# Patient Record
Sex: Female | Born: 1955 | Marital: Single | Smoking: Never smoker
Health system: Southern US, Community
[De-identification: ages and names within clinical notes are randomized; demographics above are authoritative.]

## PROBLEM LIST (undated history)

## (undated) DIAGNOSIS — N201 Calculus of ureter: Secondary | ICD-10-CM

## (undated) DIAGNOSIS — Z87442 Personal history of urinary calculi: Secondary | ICD-10-CM

## (undated) DIAGNOSIS — I341 Nonrheumatic mitral (valve) prolapse: Secondary | ICD-10-CM

## (undated) DIAGNOSIS — R200 Anesthesia of skin: Secondary | ICD-10-CM

## (undated) DIAGNOSIS — E039 Hypothyroidism, unspecified: Secondary | ICD-10-CM

## (undated) DIAGNOSIS — I10 Essential (primary) hypertension: Secondary | ICD-10-CM

## (undated) DIAGNOSIS — E785 Hyperlipidemia, unspecified: Secondary | ICD-10-CM

## (undated) DIAGNOSIS — S0450XA Injury of facial nerve, unspecified side, initial encounter: Secondary | ICD-10-CM

## (undated) DIAGNOSIS — I440 Atrioventricular block, first degree: Secondary | ICD-10-CM

## (undated) DIAGNOSIS — F419 Anxiety disorder, unspecified: Secondary | ICD-10-CM

## (undated) DIAGNOSIS — N200 Calculus of kidney: Secondary | ICD-10-CM

## (undated) HISTORY — DX: Anxiety disorder, unspecified: F41.9

## (undated) HISTORY — DX: Essential (primary) hypertension: I10

## (undated) HISTORY — DX: Hyperlipidemia, unspecified: E78.5

## (undated) HISTORY — PX: TONSILLECTOMY AND ADENOIDECTOMY: SUR1326

## (undated) HISTORY — PX: STRABISMUS SURGERY: SHX218

---

## 1985-10-11 HISTORY — PX: APPENDECTOMY: SHX54

## 1993-10-11 HISTORY — PX: TUBAL LIGATION: SHX77

## 1997-10-11 HISTORY — PX: MANDIBLE SURGERY: SHX707

## 2001-08-07 ENCOUNTER — Emergency Department (HOSPITAL_COMMUNITY): Admission: EM | Admit: 2001-08-07 | Discharge: 2001-08-07 | Payer: Self-pay

## 2002-06-18 ENCOUNTER — Other Ambulatory Visit: Admission: RE | Admit: 2002-06-18 | Discharge: 2002-06-18 | Payer: Self-pay | Admitting: Obstetrics and Gynecology

## 2003-11-11 ENCOUNTER — Other Ambulatory Visit: Admission: RE | Admit: 2003-11-11 | Discharge: 2003-11-11 | Payer: Self-pay | Admitting: Obstetrics and Gynecology

## 2005-01-27 ENCOUNTER — Other Ambulatory Visit: Admission: RE | Admit: 2005-01-27 | Discharge: 2005-01-27 | Payer: Self-pay | Admitting: Obstetrics and Gynecology

## 2005-12-21 ENCOUNTER — Other Ambulatory Visit: Admission: RE | Admit: 2005-12-21 | Discharge: 2005-12-21 | Payer: Self-pay | Admitting: Obstetrics and Gynecology

## 2007-11-08 LAB — HM PAP SMEAR

## 2010-06-07 LAB — HM MAMMOGRAPHY

## 2011-11-08 ENCOUNTER — Encounter: Payer: Self-pay | Admitting: Family Medicine

## 2011-11-08 ENCOUNTER — Ambulatory Visit (INDEPENDENT_AMBULATORY_CARE_PROVIDER_SITE_OTHER): Payer: PRIVATE HEALTH INSURANCE | Admitting: Family Medicine

## 2011-11-08 DIAGNOSIS — E039 Hypothyroidism, unspecified: Secondary | ICD-10-CM | POA: Insufficient documentation

## 2011-11-08 DIAGNOSIS — E785 Hyperlipidemia, unspecified: Secondary | ICD-10-CM

## 2011-11-08 DIAGNOSIS — F419 Anxiety disorder, unspecified: Secondary | ICD-10-CM | POA: Insufficient documentation

## 2011-11-08 DIAGNOSIS — I10 Essential (primary) hypertension: Secondary | ICD-10-CM

## 2011-11-08 LAB — HEMOGLOBIN A1C: Hgb A1c MFr Bld: 5.8 % (ref 4.0–6.0)

## 2012-01-03 ENCOUNTER — Other Ambulatory Visit: Payer: Self-pay | Admitting: Oncology

## 2012-01-26 ENCOUNTER — Other Ambulatory Visit: Payer: Self-pay | Admitting: Oncology

## 2012-01-31 ENCOUNTER — Ambulatory Visit: Payer: PRIVATE HEALTH INSURANCE | Admitting: Family Medicine

## 2012-02-14 ENCOUNTER — Ambulatory Visit (INDEPENDENT_AMBULATORY_CARE_PROVIDER_SITE_OTHER): Payer: PRIVATE HEALTH INSURANCE | Admitting: Family Medicine

## 2012-02-14 VITALS — BP 128/88 | HR 62 | Temp 98.0°F | Resp 16 | Ht 67.0 in | Wt 226.2 lb

## 2012-02-14 DIAGNOSIS — M7710 Lateral epicondylitis, unspecified elbow: Secondary | ICD-10-CM

## 2012-02-14 DIAGNOSIS — I10 Essential (primary) hypertension: Secondary | ICD-10-CM

## 2012-02-14 DIAGNOSIS — F411 Generalized anxiety disorder: Secondary | ICD-10-CM

## 2012-02-14 DIAGNOSIS — E039 Hypothyroidism, unspecified: Secondary | ICD-10-CM

## 2012-02-14 DIAGNOSIS — E785 Hyperlipidemia, unspecified: Secondary | ICD-10-CM

## 2012-02-14 MED ORDER — CLONAZEPAM 0.5 MG PO TABS: 0.5000 mg | ORAL_TABLET | Freq: Two times a day (BID) | ORAL | Status: DC | PRN

## 2012-02-14 MED ORDER — LEVOTHYROXINE SODIUM 50 MCG PO TABS: 50.0000 ug | ORAL_TABLET | Freq: Every day | ORAL | Status: DC

## 2012-02-14 MED ORDER — METOPROLOL TARTRATE 50 MG PO TABS: 50.0000 mg | ORAL_TABLET | Freq: Two times a day (BID) | ORAL | Status: DC

## 2012-02-14 MED ORDER — PRAVASTATIN SODIUM 40 MG PO TABS: 40.0000 mg | ORAL_TABLET | Freq: Every day | ORAL | Status: DC

## 2012-02-14 NOTE — Progress Notes (Signed)
  Subjective:    Patient ID: Ashley Stevenson, female    DOB: October 31, 1955, 56 y.o.   MRN: [id number]  HPI  Patient presents in routine follow up of multiple medical issues  Needs medication refills. Compliant with medications; denies side effects  Complains of lateral elbow pain that has not responded to NSAIDS or to tennis strap  SH/ Estranged husband who was living in nursing home(was a quadriplegic) died earlier today.         Teenage son. Currently with steady employment   Review of Systems     Objective:   Physical Exam  Constitutional: She appears well-developed.  Neck: Neck supple. No thyromegaly present.  Cardiovascular: Normal rate, regular rhythm and normal heart sounds.   Pulmonary/Chest: Effort normal and breath sounds normal.  Musculoskeletal:       Arms: Neurological: She is alert.  Skin: Skin is warm.        Assessment & Plan:   1. HTN (hypertension)  metoprolol (LOPRESSOR) 50 MG tablet  2. Dyslipidemia  pravastatin (PRAVACHOL) 40 MG tablet, Lipid panel  3. GAD (generalized anxiety disorder)  clonazePAM (KLONOPIN) 0.5 MG tablet  4. Hypothyroid  levothyroxine (SYNTHROID, LEVOTHROID) 50 MCG tablet  5. Lateral epicondylitis     Supportive counseling Offered to inject patient's elbow however patient declined Anticipatory guidance Follow up 3 months

## 2012-02-15 LAB — LIPID PANEL
HDL: 48 mg/dL (ref >39)
LDL Cholesterol: 154 mg/dL — ABNORMAL HIGH (ref 0–99)
Total CHOL/HDL Ratio: 5.2 Ratio

## 2012-02-16 ENCOUNTER — Encounter: Payer: Self-pay | Admitting: Family Medicine

## 2012-02-24 ENCOUNTER — Other Ambulatory Visit: Payer: Self-pay | Admitting: Family Medicine

## 2012-02-24 DIAGNOSIS — E785 Hyperlipidemia, unspecified: Secondary | ICD-10-CM

## 2012-02-24 MED ORDER — PRAVASTATIN SODIUM 40 MG PO TABS: 80.0000 mg | ORAL_TABLET | Freq: Every day | ORAL | Status: DC

## 2012-02-25 ENCOUNTER — Other Ambulatory Visit: Payer: Self-pay | Admitting: Oncology

## 2012-07-21 ENCOUNTER — Encounter: Payer: Self-pay | Admitting: Family Medicine

## 2012-07-21 ENCOUNTER — Other Ambulatory Visit: Payer: Self-pay | Admitting: *Deleted

## 2012-07-21 ENCOUNTER — Ambulatory Visit (INDEPENDENT_AMBULATORY_CARE_PROVIDER_SITE_OTHER): Payer: PRIVATE HEALTH INSURANCE | Admitting: Family Medicine

## 2012-07-21 VITALS — BP 130/84 | HR 62 | Temp 98.1°F | Resp 16 | Ht 66.0 in | Wt 226.4 lb

## 2012-07-21 DIAGNOSIS — F419 Anxiety disorder, unspecified: Secondary | ICD-10-CM

## 2012-07-21 DIAGNOSIS — Z79899 Other long term (current) drug therapy: Secondary | ICD-10-CM

## 2012-07-21 DIAGNOSIS — F411 Generalized anxiety disorder: Secondary | ICD-10-CM

## 2012-07-21 DIAGNOSIS — B309 Viral conjunctivitis, unspecified: Secondary | ICD-10-CM

## 2012-07-21 DIAGNOSIS — E785 Hyperlipidemia, unspecified: Secondary | ICD-10-CM

## 2012-07-21 DIAGNOSIS — I10 Essential (primary) hypertension: Secondary | ICD-10-CM

## 2012-07-21 DIAGNOSIS — Z23 Encounter for immunization: Secondary | ICD-10-CM

## 2012-07-21 DIAGNOSIS — E039 Hypothyroidism, unspecified: Secondary | ICD-10-CM

## 2012-07-21 MED ORDER — NAPHAZOLINE HCL 0.1 % OP SOLN: 1.0000 [drp] | Freq: Four times a day (QID) | OPHTHALMIC | Status: DC | PRN

## 2012-07-21 MED ORDER — CLONAZEPAM 0.5 MG PO TABS: 0.5000 mg | ORAL_TABLET | Freq: Two times a day (BID) | ORAL | Status: DC | PRN

## 2012-07-21 MED ORDER — ERYTHROMYCIN 5 MG/GM OP OINT: TOPICAL_OINTMENT | Freq: Four times a day (QID) | OPHTHALMIC | Status: DC

## 2012-07-21 MED ORDER — METOPROLOL TARTRATE 50 MG PO TABS: 50.0000 mg | ORAL_TABLET | Freq: Two times a day (BID) | ORAL | Status: DC

## 2012-07-21 NOTE — Progress Notes (Signed)
Subjective:    Patient ID: Ashley Stevenson, female    DOB: 13-Feb-1956, 56 y.o.   MRN: [id number]  HPI  Pt is doing well.  She is here for medication refills.  She has been on varying doses of pravastatin for yrs - after last labs, it was increased from 40 to 73 - however, pt reports she has NEVER fasted for her lab work - she was really surprised that lipids need to be drawn fasting.  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  He is in the [place] and would like to be marine.  She reports if she goes off her klonopin she gets very confused, will have a panic attack in public and someone will have to come get her.  She awoke this a.m. with a red left eye, when she blinks it feels gritty. She thought perhaps mascara or an eyelash was stuck.  She has been using visine with little relief. No photophobia, vision changes, or pain w/ eye movement.  Past Medical History  Diagnosis Date  . Hyperlipidemia   . Hypertension   . Unspecified hypothyroidism   . Anxiety     Review of Systems  Constitutional: Negative for activity change, appetite change, fatigue and unexpected weight change.  Eyes: Positive for pain and redness. Negative for photophobia, discharge, itching and visual disturbance.  Respiratory: Negative for shortness of breath.   Cardiovascular: Negative for chest pain and leg swelling.  Skin: Negative for rash.  Neurological: Negative for headaches.  Psychiatric/Behavioral: Negative for confusion and dysphoric mood. The patient is nervous/anxious.       BP 130/84  Pulse 62  Temp 98.1 F (36.7 C) (Oral)  Resp 16  Ht 5\' 6"  (1.676 m)  Wt 226 lb 6.4 oz (102.694 kg)  BMI 36.54 kg/m2  SpO2 97%  Objective:   Physical Exam  Constitutional: She is oriented to person, place, and time. She appears  well-developed and well-nourished. No distress.  HENT:  Head: Normocephalic and atraumatic.  Right Ear: External ear normal.  Left Ear: External ear normal.  Eyes: EOM and lids are normal. Pupils are equal, round, and reactive to light. No foreign bodies found. Right eye exhibits no chemosis, no discharge, no exudate and no hordeolum. No foreign body present in the right eye. Left eye exhibits chemosis. Left eye exhibits no discharge, no exudate and no hordeolum. No foreign body present in the left eye. Right conjunctiva is not injected. Right conjunctiva has no hemorrhage. Left conjunctiva is injected. Left conjunctiva has no hemorrhage. No scleral icterus.    Neck: Normal range of motion. Neck supple. No thyromegaly present.  Cardiovascular: Normal rate, regular rhythm and normal heart sounds.   Pulmonary/Chest: Effort normal and breath sounds normal.  Musculoskeletal: She exhibits no edema.  Lymphadenopathy:    She has no cervical adenopathy.  Neurological: She is alert and oriented to person, place, and time.  Skin: Skin is warm and dry. She is not diaphoretic. No erythema.  Psychiatric: She has a normal mood and affect. Her behavior is normal.          Assessment & Plan:  1. HTN - adequate control. Cont metoprolol 2. HPL - check flp - refill med AFTER reviewing lipid results to ensure dose is correct. 3.  Mult meds - check cmp 4. Hypothyroid - check tsh, refill levothyroxine AFTER reviewing tsh results. 5. Viral conjunctivitis - Napthcon and erythro oint. RTC if worsening.

## 2012-07-24 ENCOUNTER — Other Ambulatory Visit (INDEPENDENT_AMBULATORY_CARE_PROVIDER_SITE_OTHER): Payer: PRIVATE HEALTH INSURANCE | Admitting: *Deleted

## 2012-07-24 DIAGNOSIS — E039 Hypothyroidism, unspecified: Secondary | ICD-10-CM

## 2012-07-24 DIAGNOSIS — Z79899 Other long term (current) drug therapy: Secondary | ICD-10-CM

## 2012-07-24 DIAGNOSIS — E785 Hyperlipidemia, unspecified: Secondary | ICD-10-CM

## 2012-07-24 LAB — COMPREHENSIVE METABOLIC PANEL
AST: 25 U/L (ref 0–37)
Alkaline Phosphatase: 40 U/L (ref 39–117)
BUN: 18 mg/dL (ref 6–23)
Creat: 0.91 mg/dL (ref 0.50–1.10)
Glucose, Bld: 98 mg/dL (ref 70–99)
Potassium: 4.2 mEq/L (ref 3.5–5.3)
Total Bilirubin: 0.3 mg/dL (ref 0.3–1.2)

## 2012-07-24 LAB — LIPID PANEL
Cholesterol: 227 mg/dL — ABNORMAL HIGH (ref 0–200)
HDL: 48 mg/dL (ref >39)
Total CHOL/HDL Ratio: 4.7 Ratio
VLDL: 54 mg/dL — ABNORMAL HIGH (ref 0–40)

## 2012-07-24 NOTE — Progress Notes (Signed)
Pt here for lab work only

## 2012-08-05 NOTE — Progress Notes (Signed)
Reviewed and agree.

## 2012-08-20 ENCOUNTER — Other Ambulatory Visit: Payer: Self-pay | Admitting: Family Medicine

## 2012-08-20 DIAGNOSIS — E039 Hypothyroidism, unspecified: Secondary | ICD-10-CM

## 2012-08-20 DIAGNOSIS — E785 Hyperlipidemia, unspecified: Secondary | ICD-10-CM

## 2012-08-20 MED ORDER — LEVOTHYROXINE SODIUM 75 MCG PO TABS: 75.0000 ug | ORAL_TABLET | Freq: Every day | ORAL | Status: DC

## 2012-08-20 MED ORDER — PRAVASTATIN SODIUM 40 MG PO TABS: 80.0000 mg | ORAL_TABLET | Freq: Every day | ORAL | Status: DC

## 2013-04-04 ENCOUNTER — Encounter: Payer: Self-pay | Admitting: Family Medicine

## 2013-04-04 ENCOUNTER — Ambulatory Visit (INDEPENDENT_AMBULATORY_CARE_PROVIDER_SITE_OTHER): Payer: BC Managed Care – PPO | Admitting: Family Medicine

## 2013-04-04 VITALS — BP 142/80 | HR 52 | Temp 98.4°F | Resp 16 | Ht 66.25 in | Wt 229.0 lb

## 2013-04-04 DIAGNOSIS — M898X9 Other specified disorders of bone, unspecified site: Secondary | ICD-10-CM

## 2013-04-04 DIAGNOSIS — E039 Hypothyroidism, unspecified: Secondary | ICD-10-CM

## 2013-04-04 DIAGNOSIS — M949 Disorder of cartilage, unspecified: Secondary | ICD-10-CM

## 2013-04-04 DIAGNOSIS — F411 Generalized anxiety disorder: Secondary | ICD-10-CM

## 2013-04-04 DIAGNOSIS — I10 Essential (primary) hypertension: Secondary | ICD-10-CM

## 2013-04-04 DIAGNOSIS — E785 Hyperlipidemia, unspecified: Secondary | ICD-10-CM

## 2013-04-04 DIAGNOSIS — M899 Disorder of bone, unspecified: Secondary | ICD-10-CM

## 2013-04-04 MED ORDER — PRAVASTATIN SODIUM 40 MG PO TABS: 80.0000 mg | ORAL_TABLET | Freq: Every day | ORAL | Status: DC

## 2013-04-04 MED ORDER — METOPROLOL TARTRATE 50 MG PO TABS: 50.0000 mg | ORAL_TABLET | Freq: Two times a day (BID) | ORAL | Status: DC

## 2013-04-04 MED ORDER — CLONAZEPAM 0.5 MG PO TABS: 0.5000 mg | ORAL_TABLET | Freq: Two times a day (BID) | ORAL | Status: DC | PRN

## 2013-04-04 MED ORDER — LEVOTHYROXINE SODIUM 75 MCG PO TABS: 75.0000 ug | ORAL_TABLET | Freq: Every day | ORAL | Status: DC

## 2013-04-04 NOTE — Patient Instructions (Signed)
You have medication refills for at least 6-9 months. I would like for you to return in 5 months for complete physical (PAP if you need one).  Have a great summer; please get involved in VOL[employer]TEERING- your caring spirit is much needed in the community.

## 2013-04-05 LAB — COMPREHENSIVE METABOLIC PANEL
Albumin: 4.3 g/dL (ref 3.5–5.2)
Alkaline Phosphatase: 37 U/L — ABNORMAL LOW (ref 39–117)
BUN: 16 mg/dL (ref 6–23)
CO2: 24 mEq/L (ref 19–32)
Calcium: 9.7 mg/dL (ref 8.4–10.5)
Chloride: 105 mEq/L (ref 96–112)
Glucose, Bld: 91 mg/dL (ref 70–99)
Potassium: 4.6 mEq/L (ref 3.5–5.3)
Sodium: 139 mEq/L (ref 135–145)
Total Protein: 6.9 g/dL (ref 6.0–8.3)

## 2013-04-05 LAB — LIPID PANEL
Cholesterol: 154 mg/dL (ref 0–200)
HDL: 47 mg/dL (ref >39)
LDL Cholesterol: 68 mg/dL (ref 0–99)
Triglycerides: 193 mg/dL — ABNORMAL HIGH (ref <150)

## 2013-04-05 NOTE — Progress Notes (Signed)
S:  This 57 y.o. Cauc female is here for recheck re: HTN, lipid disorder and hypothyroidism. HTN is well ocntrolled on current medications; pt is trying to lose weight. She is compliant w/ medications and reports no adverse reactions. Pt has chronic anxiety related to family issues which are slowly evolving. She had a chronically ill husband who died recently; he was an alcoholic who suffered a fall 7 years ago and was a quadriplegic, residing in a long term care facility. Her 22 y.o. son, Weston Brass, has Asperger's Syndrome and thinks pt should have allowed his father to return to the home, especially after his accident. As a high functioning individual w/ Asperger's, he graduated from HS this year and has joined the [employer]. 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 He is scheduled to leave for basic training for 14 weeks in [place]. After basic training, he will return to [place] to attend college. Pt is down-sizing; her son is helping her move. This is a difficult transition time for her. She is concerned about how she will cope with son's absence and how well he will cope w/ Insurance claims handler. She has been his advocate since he was 57 years old but he rarely gives her credit for all that he has been able to achieve. She does not have many close friends and works part time. She is considering spending more time on self-care and fitness.  Patient Active Problem List   Diagnosis Date Noted  . Hyperlipidemia   . Hypertension   . Unspecified hypothyroidism   . Anxiety    PMHx, Soc Hx and Fam Hx reviewed.  ROS: As per HPI; otherwise, negative for activity change. Appetite loss, weight change, fatigue, CP or tightness, palpitations, SOB or DOE, GI problems, myalgias, HA, dizziness, weakness, numbness, behavior changes, agitation, confusion, dysphoria or significant sleep disturbance. Pt does c/o bone pain in lower extremities but denies muscle cramps, redness or swelling.  O: Filed  Vitals:   04/04/13 1115  BP: 142/80  Pulse: 52  Temp: 98.4 F (36.9 C)  Resp: 16   GEN: In NAD; WN,WD. HENT: [place]/AT; EOMI w/ clear conj/ sclerae. EACs/nose/oroph normal. NECK: No TMG. COR: RRR. LUNGS: Normal resp rate and effort. SKIN: W&D; no rashes, erythema or pallor. MS: MAEs; no deformities or muscle atrophy. NEURO: A&O x 3; CNs intact. Nonfocal. PSYCH: Appropriate affect. Pleasant and calm demeanor. Conversant and attentive. Intermittently tearful but not labile. Speech pattern and thought content normal. Judgement sound.  A/P: Hypothyroid - Plan: TSH, T3, Free, levothyroxine (SYNTHROID, LEVOTHROID) 75 MCG tablet  Dyslipidemia - Plan: Lipid panel, pravastatin (PRAVACHOL) 40 MG tablet  Bone pain - Plan: Vitamin D, 25-hydroxy  GAD (generalized anxiety disorder) - Advised pt to consider volunteering in the community; she thinks she would like to work w/ children in need.    Plan: clonazePAM (KLONOPIN) 0.5 MG tablet  HTN (hypertension) - Plan: Comprehensive metabolic panel, metoprolol (LOPRESSOR) 50 MG tablet   Meds ordered this encounter  Medications  . clonazePAM (KLONOPIN) 0.5 MG tablet    Sig: Take 1 tablet (0.5 mg total) by mouth 2 (two) times daily as needed.    Dispense:  60 tablet    Refill:  4  . levothyroxine (SYNTHROID, LEVOTHROID) 75 MCG tablet    Sig: Take 1 tablet (75 mcg total) by mouth daily.    Dispense:  30 tablet    Refill:  5  . metoprolol (LOPRESSOR) 50 MG tablet  Sig: Take 1 tablet (50 mg total) by mouth 2 (two) times daily.    Dispense:  180 tablet    Refill:  3  . pravastatin (PRAVACHOL) 40 MG tablet    Sig: Take 2 tablets (80 mg total) by mouth daily.    Dispense:  180 tablet    Refill:  3   I provided a letter for the pt to take to the [employer] to apply for scholarship program; her finances do not permit her to join a fitness center. I informed her that the [employer] has scholarships and, with a letter, she may be able to join at a discounted  rate and participate in the [employer].

## 2013-04-06 ENCOUNTER — Encounter: Payer: Self-pay | Admitting: Family Medicine

## 2013-04-07 NOTE — Progress Notes (Signed)
Quick Note:  Please contact pt and advise that the following labs are abnormal... Lipid panel is great except foe slightly elevated Triglycerides. The numbers are much improved compared to 1 year ago.  Thyroid values are normal indicating the right dose of thyroid medication. Vitamin D level is good.  Copy to pt. ______

## 2013-09-05 ENCOUNTER — Ambulatory Visit (INDEPENDENT_AMBULATORY_CARE_PROVIDER_SITE_OTHER): Payer: BC Managed Care – PPO | Admitting: Family Medicine

## 2013-09-05 ENCOUNTER — Encounter: Payer: Self-pay | Admitting: Family Medicine

## 2013-09-05 VITALS — BP 132/74 | HR 64 | Temp 98.6°F | Resp 16 | Ht 66.25 in | Wt 219.0 lb

## 2013-09-05 DIAGNOSIS — R8290 Unspecified abnormal findings in urine: Secondary | ICD-10-CM

## 2013-09-05 DIAGNOSIS — R82998 Other abnormal findings in urine: Secondary | ICD-10-CM

## 2013-09-05 DIAGNOSIS — Z124 Encounter for screening for malignant neoplasm of cervix: Secondary | ICD-10-CM

## 2013-09-05 DIAGNOSIS — Z23 Encounter for immunization: Secondary | ICD-10-CM

## 2013-09-05 DIAGNOSIS — E785 Hyperlipidemia, unspecified: Secondary | ICD-10-CM

## 2013-09-05 DIAGNOSIS — I10 Essential (primary) hypertension: Secondary | ICD-10-CM

## 2013-09-05 DIAGNOSIS — Z0000 Encounter for general adult medical examination without abnormal findings: Secondary | ICD-10-CM

## 2013-09-05 DIAGNOSIS — Z01419 Encounter for gynecological examination (general) (routine) without abnormal findings: Secondary | ICD-10-CM

## 2013-09-05 DIAGNOSIS — E039 Hypothyroidism, unspecified: Secondary | ICD-10-CM

## 2013-09-05 LAB — POCT URINALYSIS DIPSTICK
Glucose, UA: NEGATIVE
Ketones, UA: NEGATIVE
Nitrite, UA: NEGATIVE
Spec Grav, UA: >=1.03
pH, UA: 5

## 2013-09-05 LAB — ALT: ALT: 20 U/L (ref 0–35)

## 2013-09-05 LAB — CBC WITH DIFFERENTIAL/PLATELET
Eosinophils Relative: 3 % (ref 0–5)
HCT: 38.8 % (ref 36.0–46.0)
Hemoglobin: 13.5 g/dL (ref 12.0–15.0)
Lymphocytes Relative: 24 % (ref 12–46)
Lymphs Abs: 2 10*3/uL (ref 0.7–4.0)
MCH: 29.5 pg (ref 26.0–34.0)
MCV: 84.9 fL (ref 78.0–100.0)
Monocytes Relative: 9 % (ref 3–12)
Platelets: 330 10*3/uL (ref 150–400)
RBC: 4.57 MIL/uL (ref 3.87–5.11)
WBC: 8.1 10*3/uL (ref 4.0–10.5)

## 2013-09-05 LAB — BASIC METABOLIC PANEL
Chloride: 106 mEq/L (ref 96–112)
Potassium: 4.3 mEq/L (ref 3.5–5.3)
Sodium: 139 mEq/L (ref 135–145)

## 2013-09-05 LAB — IFOBT (OCCULT BLOOD): IFOBT: NEGATIVE

## 2013-09-05 MED ORDER — METOPROLOL TARTRATE 50 MG PO TABS: 50.0000 mg | ORAL_TABLET | Freq: Two times a day (BID) | ORAL | Status: DC

## 2013-09-05 MED ORDER — PRAVASTATIN SODIUM 40 MG PO TABS: 80.0000 mg | ORAL_TABLET | Freq: Every day | ORAL | Status: DC

## 2013-09-05 MED ORDER — LEVOTHYROXINE SODIUM 75 MCG PO TABS: 75.0000 ug | ORAL_TABLET | Freq: Every day | ORAL | Status: DC

## 2013-09-05 MED ORDER — CLONAZEPAM 0.5 MG PO TABS: 0.5000 mg | ORAL_TABLET | Freq: Two times a day (BID) | ORAL | Status: DC | PRN

## 2013-09-05 NOTE — Patient Instructions (Signed)
Keeping You Healthy  Get These Tests  Blood Pressure- Have your blood pressure checked by your healthcare provider at least once a year.  Normal blood pressure is 120/80.  Weight- Have your body mass index (BMI) calculated to screen for obesity.  BMI is a measure of body fat based on height and weight.  You can calculate your own BMI at https://www.west-esparza.com/  Cholesterol- Have your cholesterol checked every year.  Diabetes- Have your blood sugar checked every year if you have high blood pressure, high cholesterol, a family history of diabetes or if you are overweight.  Pap Smear- Have a pap smear every 1 to 3 years if you have been sexually active.  If you are older than 65 and recent pap smears have been normal you may not need additional pap smears.  In addition, if you have had a hysterectomy  For benign disease additional pap smears are not necessary.  Mammogram-Yearly mammograms are essential for early detection of breast cancer  Screening for Colon Cancer- Colonoscopy starting at age 78. Screening may begin sooner depending on your family history and other health conditions.  Follow up colonoscopy as directed by your Gastroenterologist.  Screening for Osteoporosis- Screening begins at age 26 with bone density scanning, sooner if you are at higher risk for developing Osteoporosis.  Get these medicines  Calcium with Vitamin D- Your body requires 1200-1500 mg of Calcium a day and [phone] IU of Vitamin D a day.  You can only absorb 500 mg of Calcium at a time therefore Calcium must be taken in 2 or 3 separate doses throughout the day.  Hormones- Hormone therapy has been associated with increased risk for certain cancers and heart disease.  Talk to your healthcare provider about if you need relief from menopausal symptoms.  Aspirin- Ask your healthcare provider about taking Aspirin to prevent Heart Disease and Stroke.  Get these Immuniztions  Flu shot- Every fall You received this  vaccine today.  Pneumonia shot- Once after the age of 60; if you are younger ask your healthcare provider if you need a pneumonia shot.  Tetanus- Every ten years. You had this vaccine in 2011.  Zostavax- Once after the age of 82 to prevent shingles.  Take these steps  Don't smoke- Your healthcare provider can help you quit. For tips on how to quit, ask your healthcare provider or go to www.smokefree.gov or call 1-800 QUIT-NOW.  Be physically active- Exercise 5 days a week for a minimum of 30 minutes.  If you are not already physically active, start slow and gradually work up to 30 minutes of moderate physical activity.  Try walking, dancing, bike riding, swimming, etc.  Eat a healthy diet- Eat a variety of healthy foods such as fruits, vegetables, whole grains, low fat milk, low fat cheeses, yogurt, lean meats, chicken, fish, eggs, dried beans, tofu, etc.  For more information go to www.thenutritionsource.org  Dental visit- Brush and floss teeth twice daily; visit your dentist twice a year.  Eye exam- Visit your Optometrist or Ophthalmologist yearly.  Drink alcohol in moderation- Limit alcohol intake to one drink or less a day.  Never drink and drive.  Depression- Your emotional health is as important as your physical health.  If you're feeling down or losing interest in things you normally enjoy, please talk to your healthcare provider.  Seat Belts- can save your life; always wear one  Smoke/Carbon Monoxide detectors- These detectors need to be installed on the appropriate level of your home.  Replace batteries at least once a year.  Violence- If anyone is threatening or hurting you, please tell your healthcare provider.  Living Will/ Health care power of attorney- Discuss with your healthcare provider and family.   You had a trace of blood in your urine today; please return within the next 2 weeks to give [place] a specimen. A good clean catch is needed but do not clean yourself to  forcefully before providing the specimen; you need to collect the urine mid-stream.

## 2013-09-05 NOTE — Progress Notes (Signed)
Subjective:    Patient ID: Ashley Stevenson, female    DOB: 1956-08-27, 57 y.o.   MRN: [id number]  HPI  This 57 y.o. Cauc female presents for CPE/PAP. Chronic conditions include HTN, hypothyroidism and hyperlipidemia. She is compliant w/ medications and has no adverse effects. Mild anxiety responds to prn Clonazepam.  HCM: MMG- Current (03/2013 negative)           PAP- 2009           IMM- Current.  Patient Active Problem List   Diagnosis Date Noted  . Hyperlipidemia   . Hypertension   . Unspecified hypothyroidism   . Anxiety    PMHx, Surg Hx, Soc and Fam Hx reviewed. Medications reconciled.   Review of Systems  Constitutional: Negative.   HENT: Positive for dental problem, postnasal drip and sinus pressure.   Eyes: Positive for redness.  Respiratory: Negative.   Cardiovascular: Negative.   Gastrointestinal: Negative.   Endocrine: Negative.   Genitourinary: Negative.   Musculoskeletal: Negative.   Skin: Negative.   Allergic/Immunologic: Negative.   Neurological: Positive for tremors.  Hematological: Negative.   Psychiatric/Behavioral: Negative.       Objective:   Physical Exam  Nursing note and vitals reviewed. Constitutional: She is oriented to person, place, and time. Vital signs are normal. She appears well-developed and well-nourished. No distress.  HENT:  Head: Normocephalic and atraumatic.  Right Ear: Hearing, tympanic membrane, external ear and ear canal normal.  Left Ear: Hearing, tympanic membrane, external ear and ear canal normal.  Nose: Nose normal. No mucosal edema, nasal deformity or septal deviation.  Mouth/Throat: Uvula is midline, oropharynx is clear and moist and mucous membranes are normal. No oral lesions. Normal dentition. No dental abscesses.  Eyes: EOM and lids are normal. Pupils are equal, round, and reactive to light. Right conjunctiva is injected. Left conjunctiva is injected. No scleral icterus.  Pt has periodic vision evaluation by eye care  professional.  Neck: Normal range of motion and full passive range of motion without pain. Neck supple. No JVD present. No spinous process tenderness and no muscular tenderness present. Carotid bruit is not present. No mass and no thyromegaly present.  Cardiovascular: Normal rate, regular rhythm, S1 normal, S2 normal, normal heart sounds, intact distal pulses and normal pulses.   No extrasystoles are present. PMI is not displaced.  Exam reveals no gallop and no friction rub.   No murmur heard. Pulmonary/Chest: Effort normal and breath sounds normal. No respiratory distress. She exhibits no tenderness. Right breast exhibits no inverted nipple, no mass, no nipple discharge, no skin change and no tenderness. Left breast exhibits no inverted nipple, no mass, no nipple discharge, no skin change and no tenderness. Breasts are symmetrical.  Abdominal: Soft. Normal appearance and bowel sounds are normal. She exhibits no distension, no pulsatile liver, no pulsatile midline mass and no mass. There is no hepatosplenomegaly. There is no tenderness. There is no rigidity, no guarding and no CVA tenderness.  Genitourinary: Rectum normal, vagina normal and uterus normal. Rectal exam shows no external hemorrhoid, no fissure, no mass, no tenderness and anal tone normal. Guaiac negative stool. There is no rash, tenderness or lesion on the right labia. There is no rash, tenderness or lesion on the left labia. Uterus is not enlarged and not tender. Cervix exhibits no motion tenderness, no discharge and no friability. Right adnexum displays no mass, no tenderness and no fullness. Left adnexum displays no mass, no tenderness and no fullness. No vaginal discharge found.  Uterus posterior w/ cervix very anterior making endocervical PAP specimen difficult to obtain.  Musculoskeletal: Normal range of motion. She exhibits no edema and no tenderness.  Lymphadenopathy:       Head (right side): No submental, no submandibular, no  tonsillar, no posterior auricular and no occipital adenopathy present.       Head (left side): No submental, no submandibular, no tonsillar, no posterior auricular and no occipital adenopathy present.    She has no cervical adenopathy.    She has no axillary adenopathy.       Right: No inguinal and no supraclavicular adenopathy present.       Left: No inguinal and no supraclavicular adenopathy present.  Neurological: She is alert and oriented to person, place, and time. She has normal strength and normal reflexes. She displays no atrophy and no tremor. No cranial nerve deficit or sensory deficit. She exhibits normal muscle tone. Coordination and gait normal.  Skin: Skin is warm, dry and intact. No ecchymosis and no rash noted. She is not diaphoretic. No cyanosis or erythema. No pallor. Nails show no clubbing.  Psychiatric: She has a normal mood and affect. Her speech is normal and behavior is normal. Judgment and thought content normal. Cognition and memory are normal.    ECG: Sinus rhythm w/ 1st degree A-V block (probably normal variant); nonspecific anterior T wave changes. No ectopy.   Results for orders placed in visit on 09/05/13  POCT URINALYSIS DIPSTICK      Result Value Range   Color, UA yellow     Clarity, UA clear     Glucose, UA neg     Bilirubin, UA small     Ketones, UA neg     Spec Grav, UA >=1.030     Blood, UA trace     pH, UA 5.0     Protein, UA neg     Urobilinogen, UA 0.2     Nitrite, UA neg     Leukocytes, UA small (1+)        Assessment & Plan:  Routine general medical examination at a health care facility - Plan: POCT urinalysis dipstick, Hepatitis C antibody, CBC with Differential, Basic metabolic panel, ALT, IFOBT POC (occult bld, rslt in office), CANCELED: IFOBT POC (occult bld, rslt in office)  Encounter for cervical Pap smear with pelvic exam - Plan: Pap IG (Image Guided)  Abnormal urine - Trace blood; pt asymptomatic.   Plan: Pt to bring in clean catch  urine next week for repeat UA.  HTN (hypertension) - Stable and controlled on current medication; continue same.  Plan: Basic metabolic panel, EKG 12-Lead, metoprolol (LOPRESSOR) 50 MG tablet  Hypothyroid - Plan: levothyroxine (SYNTHROID, LEVOTHROID) 75 MCG tablet  Dyslipidemia - Plan: pravastatin (PRAVACHOL) 40 MG tablet  Need for prophylactic vaccination and inoculation against influenza - Plan: IFOBT POC (occult bld, rslt in office)  Meds ordered this encounter  Medications  . DISCONTD: clonazePAM (KLONOPIN) 0.5 MG tablet    Sig: Take 1 tablet (0.5 mg total) by mouth 2 (two) times daily as needed.    Dispense:  60 tablet    Refill:  4  . levothyroxine (SYNTHROID, LEVOTHROID) 75 MCG tablet    Sig: Take 1 tablet (75 mcg total) by mouth daily.    Dispense:  30 tablet    Refill:  5  . metoprolol (LOPRESSOR) 50 MG tablet    Sig: Take 1 tablet (50 mg total) by mouth 2 (two) times daily.    Dispense:  180 tablet    Refill:  3  . pravastatin (PRAVACHOL) 40 MG tablet    Sig: Take 2 tablets (80 mg total) by mouth daily.    Dispense:  180 tablet    Refill:  3  . clonazePAM (KLONOPIN) 0.5 MG tablet    Sig: Take 0.5 mg by mouth 2 (two) times daily as needed for anxiety.

## 2013-09-09 NOTE — Progress Notes (Signed)
Quick Note:  Please notify pt that results are normal.   Provide pt with copy of labs. ______ 

## 2013-09-10 LAB — PAP IG (IMAGE GUIDED)

## 2013-09-12 NOTE — Progress Notes (Signed)
Quick Note:  Notify pt of Normal results. ______ 

## 2013-09-13 ENCOUNTER — Encounter: Payer: Self-pay | Admitting: *Deleted

## 2013-09-18 ENCOUNTER — Other Ambulatory Visit (INDEPENDENT_AMBULATORY_CARE_PROVIDER_SITE_OTHER): Payer: BC Managed Care – PPO | Admitting: Family Medicine

## 2013-09-18 DIAGNOSIS — N390 Urinary tract infection, site not specified: Secondary | ICD-10-CM

## 2013-09-18 LAB — POCT UA - MICROSCOPIC ONLY
Casts, Ur, LPF, POC: NEGATIVE
Crystals, Ur, HPF, POC: NEGATIVE
Mucus, UA: POSITIVE
Yeast, UA: NEGATIVE

## 2013-09-18 LAB — POCT URINALYSIS DIPSTICK
Nitrite, UA: NEGATIVE
Protein, UA: NEGATIVE
Spec Grav, UA: 1.025
Urobilinogen, UA: 0.2
pH, UA: 5

## 2013-10-08 ENCOUNTER — Other Ambulatory Visit: Payer: Self-pay | Admitting: Family Medicine

## 2013-10-09 NOTE — Telephone Encounter (Signed)
Clonazepam refill (#60 w/ 1 refill) phoned to pt's pharmacy.

## 2013-11-29 ENCOUNTER — Telehealth: Payer: Self-pay

## 2013-11-29 MED ORDER — PRAVASTATIN SODIUM 80 MG PO TABS: 80.0000 mg | ORAL_TABLET | Freq: Every day | ORAL | Status: DC

## 2013-11-29 NOTE — Telephone Encounter (Signed)
Notified pt of new Rx. She stated that there may still be a problem with the cost being increased on the pravastatin because it is not longer a generic through her ins. She thinks the only generic is lovastatin. Pt will contact pharm to see what her cost is and if too high will verify alternative and call me back.

## 2013-11-29 NOTE — Telephone Encounter (Signed)
I have signed med order for Pravastatin 80 mg.

## 2013-11-29 NOTE — Telephone Encounter (Signed)
Pt faxed notice that her ins will no longer cover 2 tabs of the 40 mg pravastatin. Called pharmacist who clarified that since there is a 80 mg tab available, ins doesn't want pt taking 2 of the 40 mg. The 40 mg is no longer on [facility]'s $4 list so no financial advantage to the 40 mg. Can we change to 80 mg. I have pended Rx w/ remaining RFs you gave pt on the 40 mg.

## 2014-01-24 ENCOUNTER — Other Ambulatory Visit: Payer: Self-pay

## 2014-01-24 DIAGNOSIS — E039 Hypothyroidism, unspecified: Secondary | ICD-10-CM

## 2014-01-24 DIAGNOSIS — I10 Essential (primary) hypertension: Secondary | ICD-10-CM

## 2014-01-24 NOTE — Telephone Encounter (Signed)
Rec request for med refill  

## 2014-01-24 NOTE — Telephone Encounter (Signed)
rec request for med refill 

## 2014-01-24 NOTE — Telephone Encounter (Signed)
rec request for refill

## 2014-01-25 MED ORDER — LEVOTHYROXINE SODIUM 75 MCG PO TABS: 75.0000 ug | ORAL_TABLET | Freq: Every day | ORAL | Status: DC

## 2014-01-28 ENCOUNTER — Other Ambulatory Visit: Payer: Self-pay

## 2014-01-28 DIAGNOSIS — E039 Hypothyroidism, unspecified: Secondary | ICD-10-CM

## 2014-01-28 DIAGNOSIS — I10 Essential (primary) hypertension: Secondary | ICD-10-CM

## 2014-01-28 NOTE — Telephone Encounter (Signed)
Received request for refills.

## 2014-01-29 MED ORDER — LEVOTHYROXINE SODIUM 75 MCG PO TABS: 75.0000 ug | ORAL_TABLET | Freq: Every day | ORAL | Status: DC

## 2014-01-29 MED ORDER — METOPROLOL TARTRATE 50 MG PO TABS: 50.0000 mg | ORAL_TABLET | Freq: Two times a day (BID) | ORAL | Status: DC

## 2014-01-29 MED ORDER — PRAVASTATIN SODIUM 80 MG PO TABS
80.0000 mg | ORAL_TABLET | Freq: Every day | ORAL | Status: DC
Start: ? — End: 2014-02-04

## 2014-02-04 ENCOUNTER — Other Ambulatory Visit: Payer: Self-pay

## 2014-02-04 DIAGNOSIS — I10 Essential (primary) hypertension: Secondary | ICD-10-CM

## 2014-02-04 DIAGNOSIS — E039 Hypothyroidism, unspecified: Secondary | ICD-10-CM

## 2014-02-04 MED ORDER — METOPROLOL TARTRATE 50 MG PO TABS: 50.0000 mg | ORAL_TABLET | Freq: Two times a day (BID) | ORAL | Status: DC

## 2014-02-04 MED ORDER — PRAVASTATIN SODIUM 80 MG PO TABS: 80.0000 mg | ORAL_TABLET | Freq: Every day | ORAL | Status: DC

## 2014-02-04 MED ORDER — LEVOTHYROXINE SODIUM 75 MCG PO TABS: 75.0000 ug | ORAL_TABLET | Freq: Every day | ORAL | Status: DC

## 2014-02-04 NOTE — Telephone Encounter (Signed)
Exp scripts sent reqs for RFs on metoprolol, pravastatin and synthroid. Dr Audria NineMcPherson wrote for a year of RFs on metoprolol and pravastatin at 09/05/13 OV so I have sent the remaining 2 RFs in. Dr Audria NineMcPherson, is it OK to send the synthroid for the remainder of the year as well? Pended.

## 2014-02-04 NOTE — Telephone Encounter (Signed)
I signed order for refills for Levothyroxine.

## 2014-02-11 ENCOUNTER — Emergency Department (HOSPITAL_COMMUNITY)
Admission: EM | Admit: 2014-02-11 | Discharge: 2014-02-12 | Disposition: A | Discharge status: Home / Self Care | Payer: No Typology Code available for payment source | Attending: Emergency Medicine | Admitting: Emergency Medicine

## 2014-02-11 ENCOUNTER — Encounter (HOSPITAL_COMMUNITY): Payer: Self-pay | Admitting: Emergency Medicine

## 2014-02-11 ENCOUNTER — Emergency Department (HOSPITAL_COMMUNITY): Payer: No Typology Code available for payment source

## 2014-02-11 DIAGNOSIS — I10 Essential (primary) hypertension: Secondary | ICD-10-CM | POA: Insufficient documentation

## 2014-02-11 DIAGNOSIS — Z87442 Personal history of urinary calculi: Secondary | ICD-10-CM | POA: Insufficient documentation

## 2014-02-11 DIAGNOSIS — Z79899 Other long term (current) drug therapy: Secondary | ICD-10-CM | POA: Insufficient documentation

## 2014-02-11 DIAGNOSIS — Z9851 Tubal ligation status: Secondary | ICD-10-CM | POA: Insufficient documentation

## 2014-02-11 DIAGNOSIS — Z8659 Personal history of other mental and behavioral disorders: Secondary | ICD-10-CM | POA: Insufficient documentation

## 2014-02-11 DIAGNOSIS — E039 Hypothyroidism, unspecified: Secondary | ICD-10-CM | POA: Insufficient documentation

## 2014-02-11 DIAGNOSIS — Z7982 Long term (current) use of aspirin: Secondary | ICD-10-CM | POA: Insufficient documentation

## 2014-02-11 DIAGNOSIS — E785 Hyperlipidemia, unspecified: Secondary | ICD-10-CM | POA: Insufficient documentation

## 2014-02-11 DIAGNOSIS — N201 Calculus of ureter: Secondary | ICD-10-CM | POA: Insufficient documentation

## 2014-02-11 DIAGNOSIS — Z9089 Acquired absence of other organs: Secondary | ICD-10-CM | POA: Insufficient documentation

## 2014-02-11 LAB — CBC WITH DIFFERENTIAL/PLATELET
Basophils Absolute: 0.1 10*3/uL (ref 0.0–0.1)
Basophils Relative: 1 % (ref 0–1)
Eosinophils Absolute: 0.4 10*3/uL (ref 0.0–0.7)
Eosinophils Relative: 3 % (ref 0–5)
HCT: 41.7 % (ref 36.0–46.0)
Hemoglobin: 14.3 g/dL (ref 12.0–15.0)
LYMPHS ABS: 4.5 10*3/uL — AB (ref 0.7–4.0)
LYMPHS PCT: 39 % (ref 12–46)
MCH: 29.2 pg (ref 26.0–34.0)
MCHC: 34.3 g/dL (ref 30.0–36.0)
MCV: 85.3 fL (ref 78.0–100.0)
MONOS PCT: 8 % (ref 3–12)
Monocytes Absolute: 0.9 10*3/uL (ref 0.1–1.0)
NEUTROS PCT: 50 % (ref 43–77)
Neutro Abs: 5.7 10*3/uL (ref 1.7–7.7)
Platelets: 339 10*3/uL (ref 150–400)
RBC: 4.89 MIL/uL (ref 3.87–5.11)
RDW: 14.3 % (ref 11.5–15.5)
WBC: 11.5 10*3/uL — AB (ref 4.0–10.5)

## 2014-02-11 LAB — BASIC METABOLIC PANEL
BUN: 21 mg/dL (ref 6–23)
CHLORIDE: 103 meq/L (ref 96–112)
CO2: 22 meq/L (ref 19–32)
Calcium: 9.9 mg/dL (ref 8.4–10.5)
Creatinine, Ser: 0.83 mg/dL (ref 0.50–1.10)
GFR calc Af Amer: 88 mL/min — ABNORMAL LOW (ref >90)
GFR calc non Af Amer: 76 mL/min — ABNORMAL LOW (ref >90)
Glucose, Bld: 118 mg/dL — ABNORMAL HIGH (ref 70–99)
POTASSIUM: 4.3 meq/L (ref 3.7–5.3)
SODIUM: 143 meq/L (ref 137–147)

## 2014-02-11 MED ORDER — HYDROMORPHONE HCL PF 1 MG/ML IJ SOLN
1.0000 mg | Freq: Once | INTRAMUSCULAR | Status: AC
  Administered 2014-02-11: 1 mg via INTRAVENOUS
  Filled 2014-02-11: qty 1

## 2014-02-11 MED ORDER — SODIUM CHLORIDE 0.9 % IV BOLUS (SEPSIS)
1000.0000 mL | Freq: Once | INTRAVENOUS | Status: AC
  Administered 2014-02-11: 1000 mL via INTRAVENOUS

## 2014-02-11 MED ORDER — KETOROLAC TROMETHAMINE 30 MG/ML IJ SOLN
30.0000 mg | Freq: Once | INTRAMUSCULAR | Status: AC
  Administered 2014-02-11: 30 mg via INTRAVENOUS
  Filled 2014-02-11: qty 1

## 2014-02-11 MED ORDER — ONDANSETRON HCL 4 MG/2ML IJ SOLN
4.0000 mg | Freq: Once | INTRAMUSCULAR | Status: AC
  Administered 2014-02-11: 4 mg via INTRAVENOUS
  Filled 2014-02-11: qty 2

## 2014-02-11 MED ORDER — MORPHINE SULFATE 4 MG/ML IJ SOLN
4.0000 mg | Freq: Once | INTRAMUSCULAR | Status: AC
  Administered 2014-02-11: 4 mg via INTRAVENOUS
  Filled 2014-02-11: qty 1

## 2014-02-11 NOTE — ED Notes (Signed)
Pt complains of acute onset of left flank pain with nausea

## 2014-02-11 NOTE — ED Provider Notes (Signed)
CSN: [id number]     Arrival date & time 02/11/14  2225 History   First MD Initiated Contact with Patient 02/11/14 2306     Chief Complaint  Patient presents with  . Flank Pain     (Consider location/radiation/quality/duration/timing/severity/associated sxs/prior Treatment) HPI Hx per PT - L flank pain, severe sharp and not radiating, had some mild pain earlier but then became severe with associated N/V, has h/o kidney stones and feels the same. Noticed dark urine tonight, not obvious hematuria. No F/C.   Past Medical History  Diagnosis Date  . Hyperlipidemia   . Hypertension   . Unspecified hypothyroidism   . Anxiety    Past Surgical History  Procedure Laterality Date  . Tonsillectomy and adenoidectomy    . Tubal ligation    . Mandible fracture surgery    . Appendectomy    . Eye surgery     Family History  Problem Relation Age of Onset  . Heart disease Mother   . Hyperlipidemia Mother   . Hypertension Mother   . Hyperlipidemia Father   . Hypertension Sister   . Cancer Sister   . Hypertension Sister   . Cancer Sister   . Hypertension Sister   . Hypertension Sister   . Hypertension Sister   . Hypertension Sister    History  Substance Use Topics  . Smoking status: Never Smoker   . Smokeless tobacco: Not on file  . Alcohol Use: No   OB History   Grav Para Term Preterm Abortions TAB SAB Ect Mult Living                 Review of Systems  Constitutional: Negative for fever and chills.  Respiratory: Negative for shortness of breath.   Cardiovascular: Negative for chest pain.  Gastrointestinal: Positive for vomiting. Negative for diarrhea.  Genitourinary: Positive for flank pain. Negative for dysuria.  Musculoskeletal: Negative for back pain, neck pain and neck stiffness.  Skin: Negative for rash.  Neurological: Negative for headaches.  All other systems reviewed and are negative.     Allergies  Benazepril hcl; Tessalon perles; and Sulfa antibiotics  Home  Medications   Prior to Admission medications   Medication Sig Start Date End Date Taking? Authorizing Provider  aspirin 81 MG tablet Take 81 mg by mouth daily.   Yes Dois DavenportKaren L Richter, MD  fish oil-omega-3 fatty acids 1000 MG capsule Take 2 g by mouth daily.   Yes Historical Provider, MD  levothyroxine (SYNTHROID, LEVOTHROID) 75 MCG tablet Take 1 tablet (75 mcg total) by mouth daily. 02/04/14  Yes Maurice MarchBarbara B McPherson, MD  metoprolol (LOPRESSOR) 50 MG tablet Take 1 tablet (50 mg total) by mouth 2 (two) times daily. 02/04/14  Yes Maurice MarchBarbara B McPherson, MD  Multiple Vitamin (MULTIVITAMIN) tablet Take 1 tablet by mouth daily.   Yes Historical Provider, MD  pravastatin (PRAVACHOL) 80 MG tablet Take 80 mg by mouth daily. 02/04/14  Yes Maurice MarchBarbara B McPherson, MD   BP 116/99  Pulse 89  Temp(Src) 98.3 F (36.8 C) (Oral)  Resp 24  SpO2 96% Physical Exam  Constitutional: She is oriented to person, place, and time. She appears well-developed and well-nourished.  HENT:  Head: Normocephalic and atraumatic.  Eyes: EOM are normal. Pupils are equal, round, and reactive to light.  Neck: Neck supple.  Cardiovascular: Normal rate, regular rhythm and intact distal pulses.   Pulmonary/Chest: Effort normal and breath sounds normal. No respiratory distress.  Abdominal: Soft. Bowel sounds are normal. She exhibits  no distension.  Tenderness over L flank, no CVAT, no ABD tenderness otherwise  Musculoskeletal: Normal range of motion. She exhibits no edema.  Neurological: She is alert and oriented to person, place, and time.  Skin: Skin is warm and dry.    ED Course  Procedures (including critical care time) Labs Review Labs Reviewed  URINALYSIS, ROUTINE W REFLEX MICROSCOPIC - Abnormal; Notable for the following:    Color, Urine AMBER (*)    APPearance TURBID (*)    Specific Gravity, Urine 1.038 (*)    Hgb urine dipstick LARGE (*)    Bilirubin Urine SMALL (*)    Ketones, ur 15 (*)    Protein, ur 30 (*)     Leukocytes, UA MODERATE (*)    All other components within normal limits  CBC WITH DIFFERENTIAL - Abnormal; Notable for the following:    WBC 11.5 (*)    Lymphs Abs 4.5 (*)    All other components within normal limits  BASIC METABOLIC PANEL - Abnormal; Notable for the following:    Glucose, Bld 118 (*)    GFR calc non Af Amer 76 (*)    GFR calc Af Amer 88 (*)    All other components within normal limits  URINE MICROSCOPIC-ADD ON - Abnormal; Notable for the following:    Squamous Epithelial / LPF MANY (*)    Bacteria, UA MANY (*)    Crystals CA OXALATE CRYSTALS (*)    All other components within normal limits    Imaging Review Ct Abdomen Pelvis Wo Contrast  02/12/2014   CLINICAL DATA:  Left-sided flank pain.  History of stones.  EXAM: CT ABDOMEN AND PELVIS WITHOUT CONTRAST  TECHNIQUE: Multidetector CT imaging of the abdomen and pelvis was performed following the standard protocol without IV contrast.  COMPARISON:  None.  FINDINGS: BODY WALL: Unremarkable.  LOWER CHEST: Unremarkable.  ABDOMEN/PELVIS:  Liver: 4 mm low-density right lateral of the gallbladder fossa is too small for accurate density measurement. This is likely an incidental cyst or other benign process.  Biliary: No evidence of biliary obstruction or stone.  Pancreas: Unremarkable.  Spleen: Unremarkable.  Adrenals: Unremarkable.  Kidneys and ureters: 6 mm stone in the proximal left ureter with mild hydronephrosis. There is a 4 mm stone in the lower pole left kidney. No right-sided nephrolithiasis. Calcification near the lower third of the left ureter has an appearance most typical of phlebolith.  Bladder: Unremarkable.  Reproductive: Unremarkable.  Bowel: Extensive sigmoid diverticulosis. No pericecal inflammation.  Retroperitoneum: No mass or adenopathy.  Peritoneum: No free fluid or gas.  Vascular: No acute abnormality.  OSSEOUS: No acute abnormalities.  IMPRESSION: 1. Obstructing 6 mm proximal left ureteral stone. 2. Nonobstructive  left lower pole nephrolithiasis. 3. Sigmoid diverticulosis.   Electronically Signed   By: Tiburcio Pea M.D.   On: 02/12/2014 00:14    IV Fentanyl. IV Toradol. IV zofran On recheck still having significant pain and IV Dilaudid provided After IV Dilaudid, remains pain-free in the ER. No further nausea or vomiting. 2:39 AM IV antibiotics for bacteria and urine , possible contaminated sample, urine culture pending.   Patient is comfortable the plan discharge home, prescription for Percocet and Zofran and Vantin. She agrees to strict return precautions and followup with urologist.   MDM   Diagnosis: Left ureterolithiasis  Adult female with history of kidney stone in the past presents with severe sudden onset left flank pain. CT scan obtained as above shows obstructing 6 mm proximal left ureteral stone.  Pain improved with IV narcotics. Labs and urinalysis reviewed. Possible UTI - ABx provided. Condition improved. Vital signs and nursing notes reviewed and considered.    Sunnie NielsenBrian Bridgid Printz, MD 02/12/14 [phone]

## 2014-02-11 NOTE — ED Notes (Signed)
Pt is aware of the need for urine. 

## 2014-02-12 ENCOUNTER — Other Ambulatory Visit: Payer: Self-pay | Admitting: Urology

## 2014-02-12 LAB — URINE MICROSCOPIC-ADD ON

## 2014-02-12 LAB — URINALYSIS, ROUTINE W REFLEX MICROSCOPIC
Glucose, UA: NEGATIVE mg/dL
Ketones, ur: 15 mg/dL — AB
NITRITE: NEGATIVE
Protein, ur: 30 mg/dL — AB
SPECIFIC GRAVITY, URINE: 1.038 — AB (ref 1.005–1.030)
Urobilinogen, UA: 1 mg/dL (ref 0.0–1.0)
pH: 5 (ref 5.0–8.0)

## 2014-02-12 MED ORDER — CEFPODOXIME PROXETIL 100 MG PO TABS: 100.0000 mg | ORAL_TABLET | Freq: Two times a day (BID) | ORAL | Status: DC

## 2014-02-12 MED ORDER — DEXTROSE 5 % IV SOLN
1.0000 g | Freq: Once | INTRAVENOUS | Status: AC
  Administered 2014-02-12: 1 g via INTRAVENOUS
  Filled 2014-02-12: qty 10

## 2014-02-12 MED ORDER — OXYCODONE-ACETAMINOPHEN 5-325 MG PO TABS: 1.0000 | ORAL_TABLET | ORAL | Status: DC | PRN

## 2014-02-12 MED ORDER — ONDANSETRON HCL 4 MG PO TABS: 4.0000 mg | ORAL_TABLET | Freq: Four times a day (QID) | ORAL | Status: DC

## 2014-02-12 NOTE — Discharge Instructions (Signed)
Kidney Stones Kidney stones (urolithiasis) are deposits that form inside your kidneys. The intense pain is caused by the stone moving through the urinary tract. When the stone moves, the ureter goes into spasm around the stone. The stone is usually passed in the urine.  CAUSES   A disorder that makes certain neck glands produce too much parathyroid hormone (primary hyperparathyroidism).  A buildup of uric acid crystals, similar to gout in your joints.  Narrowing (stricture) of the ureter.  A kidney obstruction present at birth (congenital obstruction).  Previous surgery on the kidney or ureters.  Numerous kidney infections. SYMPTOMS   Feeling sick to your stomach (nauseous).  Throwing up (vomiting).  Blood in the urine (hematuria).  Pain that usually spreads (radiates) to the groin.  Frequency or urgency of urination. DIAGNOSIS   Taking a history and physical exam.  Blood or urine tests.  CT scan.  Occasionally, an examination of the inside of the urinary bladder (cystoscopy) is performed. TREATMENT   Observation.  Increasing your fluid intake.  Extracorporeal shock wave lithotripsy This is a noninvasive procedure that uses shock waves to break up kidney stones.  Surgery may be needed if you have severe pain or persistent obstruction. There are various surgical procedures. Most of the procedures are performed with the use of small instruments. Only small incisions are needed to accommodate these instruments, so recovery time is minimized. The size, location, and chemical composition are all important variables that will determine the proper choice of action for you. Talk to your health care provider to better understand your situation so that you will minimize the risk of injury to yourself and your kidney.  HOME CARE INSTRUCTIONS   Drink enough water and fluids to keep your urine clear or pale yellow. This will help you to pass the stone or stone fragments.  Strain  all urine through the provided strainer. Keep all particulate matter and stones for your health care provider to see. The stone causing the pain may be as small as a grain of salt. It is very important to use the strainer each and every time you pass your urine. The collection of your stone will allow your health care provider to analyze it and verify that a stone has actually passed. The stone analysis will often identify what you can do to reduce the incidence of recurrences.  Only take over-the-counter or prescription medicines for pain, discomfort, or fever as directed by your health care provider.  Make a follow-up appointment with your health care provider as directed.  Get follow-up X-rays if required. The absence of pain does not always mean that the stone has passed. It may have only stopped moving. If the urine remains completely obstructed, it can cause loss of kidney function or even complete destruction of the kidney. It is your responsibility to make sure X-rays and follow-ups are completed. Ultrasounds of the kidney can show blockages and the status of the kidney. Ultrasounds are not associated with any radiation and can be performed easily in a matter of minutes. SEEK MEDICAL CARE IF:  You experience pain that is progressive and unresponsive to any pain medicine you have been prescribed. SEEK IMMEDIATE MEDICAL CARE IF:   Pain cannot be controlled with the prescribed medicine.  You have a fever or shaking chills.  The severity or intensity of pain increases over 18 hours and is not relieved by pain medicine.  You develop a new onset of abdominal pain.  You feel faint or pass  out.  You are unable to urinate. MAKE SURE YOU:   Understand these instructions.  Will watch your condition.  Will get help right away if you are not doing well or get worse.  Diet for Kidney Stones Kidney stones are small, hard masses that form inside your kidneys. They are made up of salts and  minerals and often form when high levels build up in the urine. The minerals can then start to build up, crystalize, and stick together to form stones. There are several different types of kidney stones. The following types of stones may be influenced by dietary factors:   Calcium Oxalate Stones. An oxalate is a salt found in certain foods. Within the body, calcium can combine with oxalates to form calcium oxalate stones, which can be excreted in the urine in high amounts. This is the most common type of kidney stone.  Calcium Phosphate Stones. These stones may occur when the pH of the urine becomes too high, or less acidic, from too much calcium being excreted in the urine. The pH is a measure of how acidic or basic a substance is.  Uric Acid Stones. This type of stone occurs when the pH of the urine becomes too low, or very acidic, because substances called purines build up in the urine. Purines are found in animal proteins. When the urine is highly concentrated with acid, uric acid kidney stones can form.  Other risk factors for kidney stones include genetics, environment, and being overweight. Your caregiver may ask you to follow specific diet guidelines based on the type of stone you have to lessen the chances of your body making more kidney stones.  GENERAL GUIDELINES FOR ALL TYPES OF STONES  Drink plenty of fluid. Drink 12 16 cups of fluid a day, drinking mainly water.This is the most important thing you can do to prevent the formation of future kidney stones.  Maintain a healthy weight. Your caregiver or dietitian can help you determine what a healthy weight is for you. If you are overweight, weight loss may help prevent the formation of future kidney stones.  Eat a diet adequate in animal protein. Too much animal protein can contribute to the formation of stones. Your dietitian can help you determine how much protein you should be eating. Avoid low carbohydrate, high protein diets.  Follow  a balanced eating approach. The DASH diet, which stands for "Dietary Approaches to Stop Hypertension," is an effective meal plan for reducing stone formation. This diet is high in fruits, vegetables, dairy, and whole grains and low in animal protein. Ask your caregiver or dietitian for information about the DASH diet. ADDITIONAL DIET GUIDELINES FOR CALCIUM STONES Avoid foods high in salt. This includes table salt, salt seasonings, MSG, soy sauce, cured and processed meats, salted crackers and snack foods, fast food, and canned soups and foods. Ask your caregiver or dietitian for information about reducing sodium in your diet or following the low sodium diet.  Ensure adequate calcium intake. Use the following table for calcium guidelines:  Men 60 years old and younger  1000 mg/day.  Men 60 years old and older  1500 mg/day.  Women 5525 60 years old  1000 mg/day.  Women 50 years and older  1500 mg/day. Your dietitian can help you determine if you are getting enough calcium in your diet. Foods that are high in calcium include dairy products, broccoli, cheese, yogurt, and pudding. If you need to take a calcium supplement, take it only in the form  of calcium citrate.  Avoid foods high in oxalate. Be sure that any supplements you take do not contain more than 500 mg of vitamin C. Vitamin C is converted into oxalate in the body. You do not need to avoid fruits and vegetables high in vitamin C.   Grains: High-fiber or bran cereal, whole-wheat bread, grits, barley, buckwheat, amaranth, pretzels, and fruitcake.  Vegetables: Dried beans, wax beans, dark leafy greens, eggplant, leeks, okra, parsley, rutabaga, tomato paste, watercress, zucchini, and escarole.  Fruit: Dried apricots, red currants, figs, kiwi, and rhubarb.  Meat and Meat Substitutes: Soybeans and foods made from soy (soyburger, miso), dried beans, peanut butter.  Milk: Chocolate milk mixes and soymilk.  Fats and Oils: Nuts (peanuts, almonds,  pecans, cashews, hazelnuts) and nut butters, sesame seeds, and tDahini paste.  Condiments/Miscellaneous: Chocolate, carob, marmalade, poppy seeds, instant iced tea, and juice from high-oxalate fruits.  Document Released: 01/22/2011 Document Revised: 03/28/2012 Document Reviewed: 03/13/2012 [facility] Patient Information 2014 [place], [place]. Document Released: 09/27/2005 Document Revised: 05/30/2013 Document Reviewed: 02/28/2013 [facility] Patient Information 2014 [place], [place].

## 2014-02-13 ENCOUNTER — Encounter (HOSPITAL_BASED_OUTPATIENT_CLINIC_OR_DEPARTMENT_OTHER): Payer: Self-pay | Admitting: *Deleted

## 2014-02-13 NOTE — Progress Notes (Signed)
NPO AFTER MN. ARRIVE AT 1100. CURRENT LAB RESULTS AND EKG IN EPIC AND CHART. WILL TAKE METOPROLOL , SYNTHROID, AND PRAVACHOL AM DOS W/ SIPS OF WATER AND IF NEEDED TAKE OXYCODONE/ ZOFRAN.

## 2014-02-14 ENCOUNTER — Ambulatory Visit (HOSPITAL_BASED_OUTPATIENT_CLINIC_OR_DEPARTMENT_OTHER): Payer: No Typology Code available for payment source | Admitting: Anesthesiology

## 2014-02-14 ENCOUNTER — Encounter (HOSPITAL_BASED_OUTPATIENT_CLINIC_OR_DEPARTMENT_OTHER): Payer: Self-pay | Admitting: *Deleted

## 2014-02-14 ENCOUNTER — Ambulatory Visit (HOSPITAL_BASED_OUTPATIENT_CLINIC_OR_DEPARTMENT_OTHER)
Admission: RE | Admit: 2014-02-14 | Discharge: 2014-02-14 | Disposition: A | Discharge status: Home / Self Care | Payer: No Typology Code available for payment source | Source: Ambulatory Visit | Attending: Urology | Admitting: Urology

## 2014-02-14 ENCOUNTER — Encounter (HOSPITAL_BASED_OUTPATIENT_CLINIC_OR_DEPARTMENT_OTHER): Payer: No Typology Code available for payment source | Admitting: Anesthesiology

## 2014-02-14 ENCOUNTER — Encounter (HOSPITAL_BASED_OUTPATIENT_CLINIC_OR_DEPARTMENT_OTHER)
Admission: RE | Disposition: A | Discharge status: Home / Self Care | Payer: Self-pay | Source: Ambulatory Visit | Attending: Urology

## 2014-02-14 DIAGNOSIS — R82998 Other abnormal findings in urine: Secondary | ICD-10-CM | POA: Insufficient documentation

## 2014-02-14 DIAGNOSIS — F410 Panic disorder [episodic paroxysmal anxiety] without agoraphobia: Secondary | ICD-10-CM | POA: Insufficient documentation

## 2014-02-14 DIAGNOSIS — I10 Essential (primary) hypertension: Secondary | ICD-10-CM | POA: Insufficient documentation

## 2014-02-14 DIAGNOSIS — E039 Hypothyroidism, unspecified: Secondary | ICD-10-CM | POA: Insufficient documentation

## 2014-02-14 DIAGNOSIS — I059 Rheumatic mitral valve disease, unspecified: Secondary | ICD-10-CM | POA: Insufficient documentation

## 2014-02-14 DIAGNOSIS — Z888 Allergy status to other drugs, medicaments and biological substances status: Secondary | ICD-10-CM | POA: Insufficient documentation

## 2014-02-14 DIAGNOSIS — N201 Calculus of ureter: Secondary | ICD-10-CM | POA: Insufficient documentation

## 2014-02-14 DIAGNOSIS — I440 Atrioventricular block, first degree: Secondary | ICD-10-CM | POA: Insufficient documentation

## 2014-02-14 DIAGNOSIS — N1330 Unspecified hydronephrosis: Secondary | ICD-10-CM | POA: Insufficient documentation

## 2014-02-14 DIAGNOSIS — E669 Obesity, unspecified: Secondary | ICD-10-CM | POA: Insufficient documentation

## 2014-02-14 DIAGNOSIS — E785 Hyperlipidemia, unspecified: Secondary | ICD-10-CM | POA: Insufficient documentation

## 2014-02-14 DIAGNOSIS — F40298 Other specified phobia: Secondary | ICD-10-CM | POA: Insufficient documentation

## 2014-02-14 DIAGNOSIS — N135 Crossing vessel and stricture of ureter without hydronephrosis: Secondary | ICD-10-CM | POA: Insufficient documentation

## 2014-02-14 DIAGNOSIS — Z882 Allergy status to sulfonamides status: Secondary | ICD-10-CM | POA: Insufficient documentation

## 2014-02-14 HISTORY — DX: Injury of facial nerve, unspecified side, initial encounter: S04.50XA

## 2014-02-14 HISTORY — DX: Hypothyroidism, unspecified: E03.9

## 2014-02-14 HISTORY — DX: Nonrheumatic mitral (valve) prolapse: I34.1

## 2014-02-14 HISTORY — DX: Calculus of kidney: N20.0

## 2014-02-14 HISTORY — DX: Calculus of ureter: N20.1

## 2014-02-14 HISTORY — PX: CYSTOSCOPY WITH RETROGRADE PYELOGRAM, URETEROSCOPY AND STENT PLACEMENT: SHX5789

## 2014-02-14 HISTORY — DX: Anesthesia of skin: R20.0

## 2014-02-14 HISTORY — DX: Atrioventricular block, first degree: I44.0

## 2014-02-14 HISTORY — DX: Personal history of urinary calculi: Z87.442

## 2014-02-14 LAB — URINE CULTURE: Colony Count: 25000

## 2014-02-14 LAB — POCT I-STAT, CHEM 8
BUN: 14 mg/dL (ref 6–23)
CALCIUM ION: 1.27 mmol/L — AB (ref 1.12–1.23)
Chloride: 105 mEq/L (ref 96–112)
Creatinine, Ser: 1 mg/dL (ref 0.50–1.10)
GLUCOSE: 101 mg/dL — AB (ref 70–99)
HCT: 42 % (ref 36.0–46.0)
Hemoglobin: 14.3 g/dL (ref 12.0–15.0)
Potassium: 3.9 mEq/L (ref 3.7–5.3)
Sodium: 146 mEq/L (ref 137–147)
TCO2: 27 mmol/L (ref 0–100)

## 2014-02-14 SURGERY — CYSTOURETEROSCOPY, WITH RETROGRADE PYELOGRAM AND STENT INSERTION
Anesthesia: General | Site: Ureter | Laterality: Left

## 2014-02-14 MED ORDER — SODIUM CHLORIDE 0.9 % IR SOLN
Status: DC | PRN
  Administered 2014-02-14: 4000 mL

## 2014-02-14 MED ORDER — KETOROLAC TROMETHAMINE 30 MG/ML IJ SOLN
15.0000 mg | Freq: Once | INTRAMUSCULAR | Status: DC | PRN
  Filled 2014-02-14: qty 1

## 2014-02-14 MED ORDER — MIDAZOLAM HCL 2 MG/2ML IJ SOLN
INTRAMUSCULAR | Status: AC
  Filled 2014-02-14: qty 2

## 2014-02-14 MED ORDER — PROPOFOL INFUSION 10 MG/ML OPTIME
INTRAVENOUS | Status: DC | PRN
Start: 2014-02-14 — End: 2014-02-14
  Administered 2014-02-14 (×2): 50 mL via INTRAVENOUS
  Administered 2014-02-14: 150 mL via INTRAVENOUS

## 2014-02-14 MED ORDER — GENTAMICIN IN SALINE 1.6-0.9 MG/ML-% IV SOLN
80.0000 mg | INTRAVENOUS | Status: DC
  Filled 2014-02-14: qty 50

## 2014-02-14 MED ORDER — GENTAMICIN SULFATE 40 MG/ML IJ SOLN
5.0000 mg/kg | INTRAVENOUS | Status: DC
  Administered 2014-02-14: 380 mg via INTRAVENOUS
  Filled 2014-02-14: qty 9.5

## 2014-02-14 MED ORDER — FENTANYL CITRATE 0.05 MG/ML IJ SOLN
INTRAMUSCULAR | Status: DC | PRN
  Administered 2014-02-14 (×2): 50 ug via INTRAVENOUS

## 2014-02-14 MED ORDER — FENTANYL CITRATE 0.05 MG/ML IJ SOLN
INTRAMUSCULAR | Status: AC
  Filled 2014-02-14: qty 6

## 2014-02-14 MED ORDER — IOHEXOL 350 MG/ML SOLN
INTRAVENOUS | Status: DC | PRN
  Administered 2014-02-14: 4 mL

## 2014-02-14 MED ORDER — OXYCODONE-ACETAMINOPHEN 5-325 MG PO TABS: 1.0000 | ORAL_TABLET | Freq: Four times a day (QID) | ORAL | Status: DC | PRN

## 2014-02-14 MED ORDER — ACETAMINOPHEN 10 MG/ML IV SOLN
INTRAVENOUS | Status: DC | PRN
  Administered 2014-02-14: 1000 mg via INTRAVENOUS

## 2014-02-14 MED ORDER — OXYBUTYNIN CHLORIDE 5 MG PO TABS
ORAL_TABLET | ORAL | Status: AC
  Filled 2014-02-14: qty 1

## 2014-02-14 MED ORDER — KETOROLAC TROMETHAMINE 30 MG/ML IJ SOLN
INTRAMUSCULAR | Status: DC | PRN
  Administered 2014-02-14: 30 mg via INTRAVENOUS

## 2014-02-14 MED ORDER — FENTANYL CITRATE 0.05 MG/ML IJ SOLN
25.0000 ug | INTRAMUSCULAR | Status: DC | PRN
  Filled 2014-02-14: qty 1

## 2014-02-14 MED ORDER — DEXAMETHASONE SODIUM PHOSPHATE 10 MG/ML IJ SOLN
INTRAMUSCULAR | Status: DC | PRN
  Administered 2014-02-14: 10 mg via INTRAVENOUS

## 2014-02-14 MED ORDER — ONDANSETRON HCL 4 MG/2ML IJ SOLN
INTRAMUSCULAR | Status: DC | PRN
  Administered 2014-02-14: 4 mg via INTRAVENOUS

## 2014-02-14 MED ORDER — SENNOSIDES-DOCUSATE SODIUM 8.6-50 MG PO TABS: 1.0000 | ORAL_TABLET | Freq: Two times a day (BID) | ORAL | Status: DC

## 2014-02-14 MED ORDER — MIDAZOLAM HCL 5 MG/5ML IJ SOLN
INTRAMUSCULAR | Status: DC | PRN
  Administered 2014-02-14: 2 mg via INTRAVENOUS

## 2014-02-14 MED ORDER — OXYBUTYNIN CHLORIDE 5 MG PO TABS
5.0000 mg | ORAL_TABLET | Freq: Once | ORAL | Status: AC
  Administered 2014-02-14: 5 mg via ORAL
  Filled 2014-02-14: qty 1

## 2014-02-14 MED ORDER — PROMETHAZINE HCL 25 MG/ML IJ SOLN
6.2500 mg | INTRAMUSCULAR | Status: DC | PRN
  Filled 2014-02-14: qty 1

## 2014-02-14 MED ORDER — LACTATED RINGERS IV SOLN
INTRAVENOUS | Status: DC
  Administered 2014-02-14 (×2): via INTRAVENOUS
  Filled 2014-02-14: qty 1000

## 2014-02-14 MED ORDER — OXYBUTYNIN CHLORIDE 5 MG PO TABS: 5.0000 mg | ORAL_TABLET | Freq: Three times a day (TID) | ORAL | Status: DC | PRN

## 2014-02-14 MED ORDER — LIDOCAINE HCL (CARDIAC) 20 MG/ML IV SOLN
INTRAVENOUS | Status: DC | PRN
  Administered 2014-02-14: 60 mg via INTRAVENOUS

## 2014-02-14 SURGICAL SUPPLY — 42 items
BAG DRAIN URO-CYSTO SKYTR STRL (DRAIN) ×4 IMPLANT
BAG DRN UROCATH (DRAIN) ×1
BASKET LASER NITINOL 1.9FR (BASKET) ×4 IMPLANT
BASKET STNLS GEMINI 4WIRE 3FR (BASKET) IMPLANT
BASKET ZERO TIP NITINOL 2.4FR (BASKET) IMPLANT
BSKT STON RTRVL 120 1.9FR (BASKET) ×1
BSKT STON RTRVL GEM 120X11 3FR (BASKET)
CANISTER SUCT LVC 12 LTR MEDI- (MISCELLANEOUS) ×4 IMPLANT
CATH INTERMIT  6FR 70CM (CATHETERS) ×4 IMPLANT
CATH URET 5FR 28IN CONE TIP (BALLOONS)
CATH URET 5FR 28IN OPEN ENDED (CATHETERS) IMPLANT
CATH URET 5FR 70CM CONE TIP (BALLOONS) IMPLANT
CLOTH BEACON ORANGE TIMEOUT ST (SAFETY) ×4 IMPLANT
DRAPE CAMERA CLOSED 9X96 (DRAPES) ×4 IMPLANT
ELECT REM PT RETURN 9FT ADLT (ELECTROSURGICAL)
ELECTRODE REM PT RTRN 9FT ADLT (ELECTROSURGICAL) IMPLANT
FIBER LASER FLEXIVA 200 (UROLOGICAL SUPPLIES) IMPLANT
FIBER LASER FLEXIVA 365 (UROLOGICAL SUPPLIES) IMPLANT
GLOVE BIO SURGEON STRL SZ7.5 (GLOVE) ×4 IMPLANT
GLOVE BIOGEL M 6.5 STRL (GLOVE) ×4 IMPLANT
GLOVE BIOGEL PI IND STRL 6.5 (GLOVE) ×4 IMPLANT
GLOVE BIOGEL PI INDICATOR 6.5 (GLOVE) ×4
GOWN PREVENTION PLUS LG XLONG (DISPOSABLE) IMPLANT
GOWN STRL REIN XL XLG (GOWN DISPOSABLE) IMPLANT
GOWN STRL REUS W/TWL LRG LVL3 (GOWN DISPOSABLE) ×8 IMPLANT
GUIDEWIRE 0.038 PTFE COATED (WIRE) IMPLANT
GUIDEWIRE ANG ZIPWIRE 038X150 (WIRE) ×4 IMPLANT
GUIDEWIRE STR DUAL SENSOR (WIRE) ×4 IMPLANT
IV NS 1000ML (IV SOLUTION) ×2
IV NS 1000ML BAXH (IV SOLUTION) ×2 IMPLANT
IV NS IRRIG 3000ML ARTHROMATIC (IV SOLUTION) ×4 IMPLANT
KIT BALLIN UROMAX 15FX10 (LABEL) IMPLANT
KIT BALLN UROMAX 15FX4 (MISCELLANEOUS) IMPLANT
KIT BALLN UROMAX 26 75X4 (MISCELLANEOUS)
PACK CYSTOSCOPY (CUSTOM PROCEDURE TRAY) ×4 IMPLANT
SET HIGH PRES BAL DIL (LABEL)
SHEATH URET ACCESS 12FR/35CM (UROLOGICAL SUPPLIES) IMPLANT
SHEATH URET ACCESS 12FR/55CM (UROLOGICAL SUPPLIES) IMPLANT
STENT POLARIS 5FRX24 (STENTS) ×4 IMPLANT
SYRINGE 10CC LL (SYRINGE) ×4 IMPLANT
SYRINGE IRR TOOMEY STRL 70CC (SYRINGE) IMPLANT
TUBE FEEDING 8FR 16IN STR KANG (MISCELLANEOUS) ×4 IMPLANT

## 2014-02-14 NOTE — H&P (Signed)
Ashley Stevenson is an 58 y.o. female.    Chief Complaint: Pre-Op Left Ureteroscopic Stone Manipulation  HPI:    1 - Recurrent Nerpholithiasis -  Pre 2015 one episode of passage with medical therapy early 1980s. 02/2014 - Left 6mm prox ureteral (650HU, SSD 13cm) and 3mm intrarenal by ER CT of eval left flank pain.   2 - Pyuria - Pt wtih pyuria and some bacteruria from ER UA 02/2014 No fevers. Placed on empiric Cipro, CX non-specific low-growth. Repeat UCX in interval from our office negative.   PMH sig for hypothyroidism, obesity, HTN. No CV disesae. No strong blood thinners.   Today Ashley Stevenson is seen to proceed with left ureteroscopic stone manipulation for goal of stone free. NO interval fevers or stone passage.   Past Medical History  Diagnosis Date  . Hyperlipidemia   . Hypertension   . Anxiety   . Hypothyroidism   . Left ureteral calculus   . Left nephrolithiasis   . History of kidney stones   . MVP (mitral valve prolapse)     per pt  dx 1970's with no in echo severe yrs--  occasional palpitation  . 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 First degree heart block     Past Surgical History  Procedure Laterality Date  . Tonsillectomy and adenoidectomy  age 89  . Mandible surgery Bilateral 1999    upper and lower retain hardware (for osteoarthritis)  . Strabismus surgery Bilateral age 89  . Tubal ligation  1995  . Appendectomy  1987    Family History  Problem Relation Age of Onset  . Heart disease Mother   . Hyperlipidemia Mother   . Hypertension Mother   . Hyperlipidemia Father   . Hypertension Sister   . Cancer Sister   . Hypertension Sister   . Cancer Sister   . Hypertension Sister   . Hypertension Sister   . Hypertension Sister   . Hypertension Sister    Social History:  reports that she has never smoked. She has never used smokeless  tobacco. She reports that she does not drink alcohol or use illicit drugs.  Allergies:  Allergies  Allergen Reactions  . Benazepril Hcl Rash  . Tessalon Perles Other (See Comments)    "bad feeling"  . Sulfa Antibiotics Rash    No prescriptions prior to admission    No results found for this or any previous visit (from the past 48 hour(s)). No results found.  Review of Systems  Constitutional: Negative.  Negative for fever and chills.  HENT: Negative.   Eyes: Negative.   Respiratory: Negative.   Cardiovascular: Negative.   Gastrointestinal: Positive for nausea.  Genitourinary: Positive for flank pain.  Musculoskeletal: Negative.   Skin: Negative.   Neurological: Negative.   Endo/Heme/Allergies: Negative.   Psychiatric/Behavioral: Negative.     Height 5\' 6"  (1.676 m), weight 99.791 kg (220 lb). Physical Exam  Constitutional: She is oriented to person, place, and time. She appears well-developed and well-nourished.  HENT:  Head: Normocephalic.  Eyes: EOM are normal. Pupils are equal, round, and reactive to light.  Neck: Normal range of motion. Neck supple.  Cardiovascular: Normal rate and regular rhythm.   Respiratory: Effort normal and breath sounds normal.  GI: Soft. Bowel sounds are normal.  Genitourinary:  Moderate Left CVAT  Musculoskeletal: Normal  range of motion.  Neurological: She is alert and oriented to person, place, and time.  Skin: Skin is warm and dry.  Psychiatric: She has a normal mood and affect. Her behavior is normal. Judgment and thought content normal.     Assessment/Plan  1 - Recurrent Nephrolithiasis - left ureteral stone with estimated <50% chance passage with medical therapy. Stone NOT easily visible by KUB  We rediscussed ureteroscopic stone manipulation with basketing and laser-lithotripsy in detail.  We rediscussed risks including bleeding, infection, damage to kidney / ureter  bladder, rarely loss of kidney. We rediscussed anesthetic  risks and rare but serious surgical complications including DVT, PE, MI, and mortality. We specifically addressed that in 5-10% of cases a staged approach is required with stenting followed by re-attempt ureteroscopy if anatomy unfavorable. The patient voiced understanding and wises to proceed today as planned.     2 - Pyuria - Low suspicion for true infection. Interval UCX negative and has been on peri-op ABX. I feel it is reasonable to proceed with definitive stone management as per above.   [name] 02/14/2014, 7:54 AM

## 2014-02-14 NOTE — Discharge Instructions (Signed)
1 - You may have urinary urgency (bladder spasms) and bloody urine on / off with stent in place. This is normal. ° °2 - Call MD or go to ER for fever >102, severe pain / nausea / vomiting not relieved by medications, or acute change in medical status °[facility] °[phone] °Post Ureteroscopy With or Without Stent Instructions ° °Definitions: ° °Ureter: The duct that transports urine from the kidney to the bladder. °Stent:   A plastic hollow tube that is placed into the ureter, from the kidney to the                 bladder to prevent the ureter from swelling shut. ° °GENERAL INSTRUCTIONS: ° °Despite the fact that no skin incisions were used, the area around the ureter and bladder is raw and irritated. The stent is a foreign body which will further irritate the bladder wall. This irritation is manifested by increased frequency of urination, both day and night, and by an increase in the urge to urinate. In some, the urge to urinate is present almost always. Sometimes the urge is strong enough that you may not be able to stop yourself from urinating. The only real cure is to remove the stent and then give time for the bladder wall to heal which can't be done until the danger of the ureter swelling shut has passed, which varies. ° °You may see some blood in your urine while the stent is in place and a few days afterwards. Do not be alarmed, even if the urine was clear for a while. Get off your feet and drink lots of fluids until clearing occurs. If you start to pass clots or don't improve, call us. ° °DIET: °You may return to your normal diet immediately. Because of the raw surface of your bladder, alcohol, spicy foods, acid type foods and drinks with caffeine may cause irritation or frequency and should be used in moderation. To keep your urine flowing freely and to avoid constipation, drink plenty of fluids during the day ( 8-10 glasses ). °Tip: Avoid cranberry juice because it is very  acidic. ° °ACTIVITY: °Your physical activity doesn't need to be restricted. However, if you are very active, you may see some blood in your urine. We suggest that you reduce your activity under these circumstances until the bleeding has stopped. ° °BOWELS: °It is important to keep your bowels regular during the postoperative period. Straining with bowel movements can cause bleeding. A bowel movement every other day is reasonable. Use a mild laxative if needed, such as Milk of Magnesia 2-3 tablespoons, or 2 Dulcolax tablets. Call if you continue to have problems. If you have been taking narcotics for pain, before, during or after your surgery, you may be constipated. Take a laxative if necessary. ° ° °MEDICATION: °You should resume your pre-surgery medications unless told not to. In addition you will often be given an antibiotic to prevent infection. These should be taken as prescribed until the bottles are finished unless you are having an unusual reaction to one of the drugs. ° °PROBLEMS YOU SHOULD REPORT TO US: °· Fevers over 100.5 Fahrenheit. °· Heavy bleeding, or clots ( See above notes about blood in urine ). °· Inability to urinate. °· Drug reactions ( hives, rash, nausea, vomiting, diarrhea ). °· Severe burning or pain with urination that is not improving. ° °FOLLOW-UP: °You will need a follow-up appointment to monitor your progress. Call for this appointment at the number listed above.   Usually the first appointment will be about three to fourteen days after your surgery. ° ° ° ° ° °Post Anesthesia Home Care Instructions ° °Activity: °Get plenty of rest for the remainder of the day. A responsible adult should stay with you for 24 hours following the procedure.  °For the next 24 hours, DO NOT: °-Drive a car °-Operate machinery °-Drink alcoholic beverages °-Take any medication unless instructed by your physician °-Make any legal decisions or sign important papers. ° °Meals: °Start with liquid foods such as  gelatin or soup. Progress to regular foods as tolerated. Avoid greasy, spicy, heavy foods. If nausea and/or vomiting occur, drink only clear liquids until the nausea and/or vomiting subsides. Call your physician if vomiting continues. ° °Special Instructions/Symptoms: °Your throat may feel dry or sore from the anesthesia or the breathing tube placed in your throat during surgery. If this causes discomfort, gargle with warm salt water. The discomfort should disappear within 24 hours. ° °

## 2014-02-14 NOTE — Anesthesia Preprocedure Evaluation (Addendum)
Anesthesia Evaluation  Patient identified by MRN, date of birth, ID band Patient awake    Reviewed: Allergy & Precautions, H&P , NPO status , Patient's Chart, lab work & pertinent test results  Airway Mallampati: II TM Distance: >3 FB Neck ROM: Full    Dental no notable dental hx.    Pulmonary neg pulmonary ROS,  breath sounds clear to auscultation  Pulmonary exam normal       Cardiovascular hypertension, Pt. on medications and Pt. on home beta blockers Rhythm:Regular Rate:Normal     Neuro/Psych Anxiety PT IS CLAUSTROPHOBIC. No mask on pt's face.  Pt. has panic attacksnegative neurological ROS  negative psych ROS   GI/Hepatic negative GI ROS, Neg liver ROS,   Endo/Other  Hypothyroidism obesity  Renal/GU negative Renal ROS  negative genitourinary   Musculoskeletal negative musculoskeletal ROS (+)   Abdominal   Peds negative pediatric ROS (+)  Hematology negative hematology ROS (+)   Anesthesia Other Findings   Reproductive/Obstetrics negative OB ROS                          Anesthesia Physical Anesthesia Plan  ASA: II  Anesthesia Plan: General   Post-op Pain Management:    Induction: Intravenous  Airway Management Planned: LMA  Additional Equipment:   Intra-op Plan:   Post-operative Plan: Extubation in OR  Informed Consent: I have reviewed the patients History and Physical, chart, labs and discussed the procedure including the risks, benefits and alternatives for the proposed anesthesia with the patient or authorized representative who has indicated his/her understanding and acceptance.   Dental advisory given  Plan Discussed with: CRNA and Surgeon  Anesthesia Plan Comments:         Anesthesia Quick Evaluation

## 2014-02-14 NOTE — Anesthesia Postprocedure Evaluation (Signed)
  Anesthesia Post-op Note  Ashley Stevenson  Procedure(s) Performed: Procedure(s) (LRB): CYSTOSCOPY WITH RETROGRADE PYELOGRAM,  DIAGNOSTIC URETEROSCOPY AND STENT PLACEMENT (Left)  Patient Location: PACU  Anesthesia Type: General  Level of Consciousness: awake and alert   Airway and Oxygen Therapy: Patient Spontanous Breathing  Post-op Pain: mild  Post-op Assessment: Post-op Vital signs reviewed, Patient's Cardiovascular Status Stable, Respiratory Function Stable, Patent Airway and No signs of Nausea or vomiting  Last Vitals:  Filed Vitals:   02/14/14 1330  BP: 120/60  Pulse: 56  Temp:   Resp: 13    Post-op Vital Signs: stable   Complications: No apparent anesthesia complications

## 2014-02-14 NOTE — Anesthesia Procedure Notes (Signed)
Procedure Name: LMA Insertion Date/Time: 02/14/2014 12:34 PM Performed by: Tyrone NineSAUVE, Shean Gerding F Pre-anesthesia Checklist: Patient identified, Timeout performed, Emergency Drugs available, Suction available and Patient being monitored Patient Re-evaluated:Patient Re-evaluated prior to inductionOxygen Delivery Method: Circle system utilized Preoxygenation: Pre-oxygenation with 100% oxygen Intubation Type: IV induction Ventilation: Mask ventilation without difficulty LMA: LMA inserted LMA Size: 4.0 Number of attempts: 1 Airway Equipment and Method: Bite block Tube secured with: Tape Dental Injury: Teeth and Oropharynx as per pre-operative assessment

## 2014-02-14 NOTE — Brief Op Note (Signed)
02/14/2014  1:06 PM  PATIENT:  Ashley Stevenson  58 y.o. female  PRE-OPERATIVE DIAGNOSIS:  LEFT URETERAL STONE  POST-OPERATIVE DIAGNOSIS:  LEFT URETERAL STONE  PROCEDURE:  Procedure(s): CYSTOSCOPY WITH RETROGRADE PYELOGRAM,  DIAGNOSTIC URETEROSCOPY AND STENT PLACEMENT (Left)  SURGEON:  Surgeon(s) and Role:    * Sebastian Acheheodore Katelyn Broadnax, MD - Primary  PHYSICIAN ASSISTANT:   ASSISTANTS: none   ANESTHESIA:   general  EBL:  Total I/O In: 200 [I.V.:200] Out: -   BLOOD ADMINISTERED:none  DRAINS: none   LOCAL MEDICATIONS USED:  NONE  SPECIMEN:  No Specimen  DISPOSITION OF SPECIMEN:  N/A  COUNTS:  YES  TOURNIQUET:  * No tourniquets in log *  DICTATION: .Other Dictation: Dictation Number  [id number]  PLAN OF CARE: Discharge to home after PACU  PATIENT DISPOSITION:  PACU - hemodynamically stable.   Delay start of Pharmacological VTE agent (>24hrs) due to surgical blood loss or risk of bleeding: no

## 2014-02-14 NOTE — Transfer of Care (Signed)
Immediate Anesthesia Transfer of Care Note  Patient: Ashley Stevenson  Procedure(s) Performed: Procedure(s): CYSTOSCOPY WITH RETROGRADE PYELOGRAM,  DIAGNOSTIC URETEROSCOPY AND STENT PLACEMENT (Left)  Patient Location: PACU  Anesthesia Type:General  Level of Consciousness: awake, alert , oriented and patient cooperative  Airway & Oxygen Therapy: Patient Spontanous Breathing and Patient connected to nasal cannula oxygen  Post-op Assessment: Report given to PACU RN and Post -op Vital signs reviewed and stable  Post vital signs: Reviewed and stable  Complications: No apparent anesthesia complications

## 2014-02-15 ENCOUNTER — Encounter (HOSPITAL_BASED_OUTPATIENT_CLINIC_OR_DEPARTMENT_OTHER): Payer: Self-pay | Admitting: Urology

## 2014-02-15 NOTE — Op Note (Signed)
NAMBarbee Stevenson:  Ashley Stevenson              ACCOUNT NO.:  [id number]  MEDICAL RECORD NO.:  [id number]  LOCATION:                               FACILITY:  [id number]  PHYSICIAN:  Sebastian Acheheodore Srihitha Tagliaferri, MD     DATE OF BIRTH:  1956/08/29  DATE OF PROCEDURE:  02/14/2014 DATE OF DISCHARGE:  02/14/2014                              OPERATIVE REPORT   DIAGNOSIS:  Left ureteral stone, flank pain.  PROCEDURES: 1. Cystoscopy with left retrograde pyelogram and interpretation. 2. Left diagnostic ureteroscopy. 3. Insertion of left ureteral stent, 5 x 24, Polaris, no tether.  ESTIMATED BLOOD LOSS:  Nil.  COMPLICATIONS:  None.  SPECIMENS:  None.  FINDINGS: 1. Very narrow left distal ureter.  This would not easily     accommodate ureteroscope.  It was felt that stenting with interval to     allow for passive dilation would be Ashley safest way to proceed. 2. Very mild hydroureteronephrosis, appeared to be a filling defect in     Ashley proximal ureter consistent with known stone.  INDICATION:  Ms. Ashley Stevenson is a pleasant 58 year old lady with history of one prior episode of nephrolithiasis years ago.  She presented to Ashley emergency room on Feb 12, 2014, with colicky left flank pain, was found on CT imaging to have left proximal ureteral stone as well as a smaller left intrarenal stone.  She was seen in Ashley office.  Options were discussed including medical therapy versus shockwave lithotripsy versus ureteroscopy.  She had KUB, which revealed Ashley stone was not easily targetable.  Thus, shockwave lithotripsy is felt to be lesser success and she adamantly wished to proceed with left ureteroscopic stone manipulation.  Informed consent was obtained and placed in Ashley medical record.  Interval urine culture was negative.  PROCEDURE IN DETAIL:  Ashley Stevenson, was verified. Procedure being left ureteroscopic stone manipulation was confirmed. Procedure was carried out.  Time-out was performed.   Intravenous antibiotics were administered.  General LMA anesthesia was introduced. Ashley patient was placed into a low lithotomy position and sterile field was created by prepping and draping Ashley patient's vagina, introitus, and proximal thighs using iodine x3.  Next, cystourethroscopy was performed using a 22-French rigid cystoscope with 20-degree offset lens. Inspection of Ashley urinary bladder revealed no diverticula, calcifications, papular lesions.  Bilateral ureteral orifices were in Ashley normal anatomic position.  Ashley left ureteral orifice was cannulated with 6-French end-hole catheter and left retrograde pyelogram was obtained.  Left retrograde pyelogram demonstrated a very decompressed distal ureter with mild hydroureteronephrosis to Ashley level of Ashley proximal ureter and a questionable mobile filling defect in Ashley proximal ureter consistent with large-medium stone.  A 0.038 Glidewire was advanced at Ashley level of Ashley upper pole and set aside as a safety wire.  An 8-French feeding tube was placed in Ashley urinary bladder for pressure release.  Next, semi- rigid ureteroscopy was performed of Ashley intramural ureter, alongside a separate Sensor working wire.  At level approximately at 3 cm above Ashley left ureteral orifice, Ashley ureter became incredibly narrow.  Visibly, it only easily accommodated Ashley two wires.  Ashley train track technique was used to try that carefully under  direct vision, advanced Ashley ureteroscope; however, significant resistance was felt and it was felt that further direct visual dilation or any dilation would not be prudent given Ashley level of narrowing.  This was Ashley most prudent way to proceed would be to place stent, allow for passive dilation and re-attempt ureteroscopy at a later date.  As such, semi-rigid ureteroscope was exchanged for Ashley rigid cystoscope once again and a new 5 x 24 Polaris- type stent was placed over Ashley remaining safety wire.  Good proximal  and distal deployment were noted.  Proximal deployment appeared to be hooking into Ashley lower pole calyx and distal deployment with Ashley distal stent curls visible in Ashley urinary bladder, not crossing Ashley midline. Bladder was emptied per cystoscope.  Procedure was then terminated.  Ashley patient tolerated Ashley procedure well.  There were no immediate periprocedural complications.  Ashley patient was taken to Ashley postanesthesia care unit in stable condition.          ______________________________ Sebastian Acheheodore Ariannah Arenson, MD     TM/MEDQ  D:  02/14/2014  T:  02/15/2014  Job:  [id number]

## 2014-02-19 ENCOUNTER — Other Ambulatory Visit: Payer: Self-pay | Admitting: Urology

## 2014-02-27 ENCOUNTER — Ambulatory Visit: Payer: BC Managed Care – PPO | Admitting: Family Medicine

## 2014-02-28 ENCOUNTER — Encounter: Payer: Self-pay | Admitting: Family Medicine

## 2014-02-28 ENCOUNTER — Ambulatory Visit (INDEPENDENT_AMBULATORY_CARE_PROVIDER_SITE_OTHER): Payer: PRIVATE HEALTH INSURANCE | Admitting: Family Medicine

## 2014-02-28 VITALS — BP 126/70 | HR 47 | Temp 98.2°F | Resp 16 | Ht 66.0 in | Wt 217.2 lb

## 2014-02-28 DIAGNOSIS — I10 Essential (primary) hypertension: Secondary | ICD-10-CM

## 2014-02-28 DIAGNOSIS — N201 Calculus of ureter: Secondary | ICD-10-CM

## 2014-02-28 DIAGNOSIS — E039 Hypothyroidism, unspecified: Secondary | ICD-10-CM

## 2014-02-28 MED ORDER — LEVOTHYROXINE SODIUM 75 MCG PO TABS: 75.0000 ug | ORAL_TABLET | Freq: Every day | ORAL | Status: DC

## 2014-02-28 MED ORDER — OXYCODONE-ACETAMINOPHEN 5-325 MG PO TABS: 1.0000 | ORAL_TABLET | Freq: Four times a day (QID) | ORAL | Status: DC | PRN

## 2014-02-28 MED ORDER — PRAVASTATIN SODIUM 80 MG PO TABS: 80.0000 mg | ORAL_TABLET | Freq: Every morning | ORAL | Status: DC

## 2014-02-28 MED ORDER — METOPROLOL TARTRATE 50 MG PO TABS: 50.0000 mg | ORAL_TABLET | Freq: Two times a day (BID) | ORAL | Status: DC

## 2014-02-28 NOTE — Patient Instructions (Signed)
Kidney Stones  Kidney stones (urolithiasis) are deposits that form inside your kidneys. The intense pain is caused by the stone moving through the urinary tract. When the stone moves, the ureter goes into spasm around the stone. The stone is usually passed in the urine.   CAUSES   · A disorder that makes certain neck glands produce too much parathyroid hormone (primary hyperparathyroidism).  · A buildup of uric acid crystals, similar to gout in your joints.  · Narrowing (stricture) of the ureter.  · A kidney obstruction present at birth (congenital obstruction).  · Previous surgery on the kidney or ureters.  · Numerous kidney infections.  SYMPTOMS   · Feeling sick to your stomach (nauseous).  · Throwing up (vomiting).  · Blood in the urine (hematuria).  · Pain that usually spreads (radiates) to the groin.  · Frequency or urgency of urination.  DIAGNOSIS   · Taking a history and physical exam.  · Blood or urine tests.  · CT scan.  · Occasionally, an examination of the inside of the urinary bladder (cystoscopy) is performed.  TREATMENT   · Observation.  · Increasing your fluid intake.  · Extracorporeal shock wave lithotripsy This is a noninvasive procedure that uses shock waves to break up kidney stones.  · Surgery may be needed if you have severe pain or persistent obstruction. There are various surgical procedures. Most of the procedures are performed with the use of small instruments. Only small incisions are needed to accommodate these instruments, so recovery time is minimized.  The size, location, and chemical composition are all important variables that will determine the proper choice of action for you. Talk to your health care provider to better understand your situation so that you will minimize the risk of injury to yourself and your kidney.   HOME CARE INSTRUCTIONS   · Drink enough water and fluids to keep your urine clear or pale yellow. This will help you to pass the stone or stone fragments.  · Strain  all urine through the provided strainer. Keep all particulate matter and stones for your health care provider to see. The stone causing the pain may be as small as a grain of salt. It is very important to use the strainer each and every time you pass your urine. The collection of your stone will allow your health care provider to analyze it and verify that a stone has actually passed. The stone analysis will often identify what you can do to reduce the incidence of recurrences.  · Only take over-the-counter or prescription medicines for pain, discomfort, or fever as directed by your health care provider.  · Make a follow-up appointment with your health care provider as directed.  · Get follow-up X-rays if required. The absence of pain does not always mean that the stone has passed. It may have only stopped moving. If the urine remains completely obstructed, it can cause loss of kidney function or even complete destruction of the kidney. It is your responsibility to make sure X-rays and follow-ups are completed. Ultrasounds of the kidney can show blockages and the status of the kidney. Ultrasounds are not associated with any radiation and can be performed easily in a matter of minutes.  SEEK MEDICAL CARE IF:  · You experience pain that is progressive and unresponsive to any pain medicine you have been prescribed.  SEEK IMMEDIATE MEDICAL CARE IF:   · Pain cannot be controlled with the prescribed medicine.  · You have a fever   or shaking chills.  · The severity or intensity of pain increases over 18 hours and is not relieved by pain medicine.  · You develop a new onset of abdominal pain.  · You feel faint or pass out.  · You are unable to urinate.  MAKE SURE YOU:   · Understand these instructions.  · Will watch your condition.  · Will get help right away if you are not doing well or get worse.  Document Released: 09/27/2005 Document Revised: 05/30/2013 Document Reviewed: 02/28/2013  [employer]® Patient Information ©2014  [employer].

## 2014-03-01 ENCOUNTER — Ambulatory Visit (INDEPENDENT_AMBULATORY_CARE_PROVIDER_SITE_OTHER): Payer: PRIVATE HEALTH INSURANCE | Admitting: Family Medicine

## 2014-03-01 ENCOUNTER — Encounter: Payer: Self-pay | Admitting: Family Medicine

## 2014-03-01 ENCOUNTER — Telehealth: Payer: Self-pay | Admitting: Family Medicine

## 2014-03-01 VITALS — BP 139/73 | HR 52 | Temp 97.9°F | Resp 18 | Ht 66.0 in | Wt 216.4 lb

## 2014-03-01 DIAGNOSIS — N201 Calculus of ureter: Secondary | ICD-10-CM | POA: Insufficient documentation

## 2014-03-01 DIAGNOSIS — R309 Painful micturition, unspecified: Secondary | ICD-10-CM

## 2014-03-01 DIAGNOSIS — R350 Frequency of micturition: Secondary | ICD-10-CM

## 2014-03-01 DIAGNOSIS — R300 Dysuria: Secondary | ICD-10-CM

## 2014-03-01 DIAGNOSIS — N390 Urinary tract infection, site not specified: Secondary | ICD-10-CM

## 2014-03-01 LAB — POCT URINALYSIS DIPSTICK
Glucose, UA: 100
KETONES UA: NEGATIVE
Nitrite, UA: POSITIVE
Protein, UA: 100
SPEC GRAV UA: 1.01
Urobilinogen, UA: 2
pH, UA: 5

## 2014-03-01 LAB — POCT UA - MICROSCOPIC ONLY
Casts, Ur, LPF, POC: NEGATIVE
Crystals, Ur, HPF, POC: NEGATIVE
Mucus, UA: NEGATIVE
Yeast, UA: NEGATIVE

## 2014-03-01 MED ORDER — CEFUROXIME AXETIL 500 MG PO TABS: 500.0000 mg | ORAL_TABLET | Freq: Two times a day (BID) | ORAL | Status: DC

## 2014-03-01 NOTE — Progress Notes (Signed)
S:  This 58 y.o. Cauc female is here for HTN and thyroid disease follow-up. Since last visit w/ me, pt has experienced kidney stone requiring stent placement. She is scheduled to has further surgery w/ Dr. Lianne Morisheo Manny w/ [facility]. She is having persistent pain and states she did not receive pain medication at discharge from hospital. ( I advised her that the pain medication had been not print out but must have been e-prescribed by Dr. Berneice HeinrichManny or his assistant).  Pt is complaint w/ thyroid medication; not adverse effects w/ any medications. She denies diaphoresis, fatigue, vision disturbances, CP or tightness, palpitations, SOB or cough, edema, constipation, skin or hair changes, HA, dizziness, weakness or syncope.  Patient Active Problem List   Diagnosis Date Noted  . Ureteral stone 03/01/2014  . Hyperlipidemia   . Hypertension   . Unspecified hypothyroidism   . Anxiety    Prior to Admission medications   Medication Sig Start Date End Date Taking? Authorizing Provider  aspirin 81 MG tablet Take 81 mg by mouth 2 (two) times daily.    Yes Dois DavenportKaren L Richter, MD  fish oil-omega-3 fatty acids 1000 MG capsule Take 2 g by mouth daily.   Yes Historical Provider, MD  levothyroxine (SYNTHROID, LEVOTHROID) 75 MCG tablet Take 1 tablet (75 mcg total) by mouth daily before breakfast.   Yes Maurice MarchBarbara B Havah Ammon, MD  metoprolol (LOPRESSOR) 50 MG tablet Take 1 tablet (50 mg total) by mouth 2 (two) times daily.   Yes Maurice MarchBarbara B Mirabelle Cyphers, MD  Multiple Vitamin (MULTIVITAMIN) tablet Take 1 tablet by mouth daily.   Yes Historical Provider, MD  ondansetron (ZOFRAN) 4 MG tablet Take 1 tablet (4 mg total) by mouth every 6 (six) hours. 02/14/14  Yes Sunnie NielsenBrian Opitz, MD  oxybutynin (DITROPAN) 5 MG tablet Take 1 tablet (5 mg total) by mouth every 8 (eight) hours as needed for bladder spasms. / stent discomfort 02/14/14  Yes Sebastian Acheheodore Manny, MD         pravastatin (PRAVACHOL) 80 MG tablet Take 1 tablet (80 mg total) by mouth  every morning.   Yes Maurice MarchBarbara B Yen Wandell, MD  senna-docusate (SENOKOT-S) 8.6-50 MG per tablet Take 1 tablet by mouth 2 (two) times daily. While taking pain meds to prevent constipation 02/14/14  Yes Sebastian Acheheodore Manny, MD   PMHx, Surg Hx, Soc and Fam Hx reviewed.  ROS: As per HPI.  O: Filed Vitals:   02/28/14 1339  BP: 126/70  Pulse: 47  Temp: 98.2 F (36.8 C)  Resp: 16   GEN: In NAD; WN,WD.  HENT: [place]/AT; EOMI w/ clear conj/sclerae. Otherwise unremarkable. COR: RRR. LUNGS: Unlabored resp. SKIN: W&D; intact w/o erythema, jaundice or pallor. NEURO: A&O x 3; CNs intact. Nonfocal.  A/P: HTN (hypertension) - Stable and controlled. Continue same medication. Plan: metoprolol (LOPRESSOR) 50 MG tablet, DISCONTINUED: metoprolol (LOPRESSOR) 50 MG tablet  Unspecified hypothyroidism- Stable on current medication.  Ureteral stone- RX: Oxycodone for pain. Follow-up w/ urologist as scheduled.  Meds ordered this encounter  Medications  . oxyCODONE-acetaminophen (PERCOCET/ROXICET) 5-325 MG per tablet    Sig: Take 1-2 tablets by mouth every 6 (six) hours as needed for moderate pain or severe pain.    Dispense:  30 tablet    Refill:  0  . levothyroxine (SYNTHROID, LEVOTHROID) 75 MCG tablet    Sig: Take 1 tablet (75 mcg total) by mouth daily before breakfast.    Dispense:  90 tablet    Refill:  3  . metoprolol (LOPRESSOR) 50 MG  tablet    Sig: Take 1 tablet (50 mg total) by mouth 2 (two) times daily.    Dispense:  180 tablet    Refill:  3  . pravastatin (PRAVACHOL) 80 MG tablet    Sig: Take 1 tablet (80 mg total) by mouth every morning.    Dispense:  90 tablet    Refill:  3

## 2014-03-01 NOTE — Telephone Encounter (Signed)
[facility] Financial Assistance Program for patients (FAPP) phone # given to pt: [phone].

## 2014-03-01 NOTE — Patient Instructions (Addendum)
I have placed a "future order" for you to come in and give a urine specimen (lab only). Please do this during the first week of June. A urine culture is being done; the last urine culture was not helpful since no single organism grew out. We will fax office notes from today's visit to Dr. Berneice Heinrich @ [facility].

## 2014-03-01 NOTE — Progress Notes (Signed)
S: This 58 y.o. Cauc female was seen by me yesterday. She has a L ureteral stent placed by Dr. Berneice HeinrichManny at [facility] w/ follow-up and surgical procedure scheduled for March 20, 2014. Pt has persistent pelvic pain due to stent; pain has been increasing over the last few days.Urine is discolored. She has been afebrile but has decreased appetitieand nausea w/o vomiting. She is here today for urinalysis and treatment if results c/w UTI.  Patient Active Problem List   Diagnosis Date Noted  . Ureteral stone- L ureteral stent placed by Dr. Lianne Morisheo Manny ([facility]) 03/01/2014  . Hyperlipidemia   . Hypertension   . Unspecified hypothyroidism   . Anxiety    Prior to Admission medications   Medication Sig Start Date End Date Taking? Authorizing Provider  aspirin 81 MG tablet Take 81 mg by mouth 2 (two) times daily.    Yes Dois DavenportKaren L Richter, MD  fish oil-omega-3 fatty acids 1000 MG capsule Take 2 g by mouth daily.   Yes Historical Provider, MD  levothyroxine (SYNTHROID, LEVOTHROID) 75 MCG tablet Take 1 tablet (75 mcg total) by mouth daily before breakfast. 02/28/14  Yes Maurice MarchBarbara B Lilliane Sposito, MD  metoprolol (LOPRESSOR) 50 MG tablet Take 1 tablet (50 mg total) by mouth 2 (two) times daily. 02/28/14  Yes Maurice MarchBarbara B Ramzi Brathwaite, MD  Multiple Vitamin (MULTIVITAMIN) tablet Take 1 tablet by mouth daily.   Yes Historical Provider, MD  ondansetron (ZOFRAN) 4 MG tablet Take 1 tablet (4 mg total) by mouth every 6 (six) hours. 02/12/14  Yes Sunnie NielsenBrian Opitz, MD  oxybutynin (DITROPAN) 5 MG tablet Take 1 tablet (5 mg total) by mouth every 8 (eight) hours as needed for bladder spasms. / stent discomfort 02/14/14  Yes Sebastian Acheheodore Manny, MD  oxyCODONE-acetaminophen (PERCOCET/ROXICET) 5-325 MG per tablet Take 1-2 tablets by mouth every 6 (six) hours as needed for moderate pain or severe pain. 02/28/14  Yes Maurice MarchBarbara B Kerstyn Coryell, MD  pravastatin (PRAVACHOL) 80 MG tablet Take 1 tablet (80 mg total) by mouth every morning. 02/28/14  Yes Maurice MarchBarbara  B Hillis Mcphatter, MD  senna-docusate (SENOKOT-S) 8.6-50 MG per tablet Take 1 tablet by mouth 2 (two) times daily. While taking pain meds to prevent constipation 02/14/14  Yes Sebastian Acheheodore Manny, MD          PMHx, Surg Hx, Soc and Fam Hx reviewed.  ROS; As per HPI.  O: Filed Vitals:   03/01/14 1324  BP: 139/73  Pulse: 52  Temp: 97.9 F (36.6 C)  Resp: 18   GEN: In NAD: WN,WD. She appears moderately uncomfortable. SKIN: W&D; intact w/o diaphoresis, erythema or pallor. COR: RRR. LUNGS: Unlabored resp. NEURO: A&O x 3; CNs intact. Nonfocal.  Results for orders placed in visit on 03/01/14  POCT UA - MICROSCOPIC ONLY      Result Value Ref Range   WBC, Ur, HPF, POC 15-20     RBC, urine, microscopic 10-15     Bacteria, U Microscopic 1+     Mucus, UA neg     Epithelial cells, urine per micros 0-2     Crystals, Ur, HPF, POC neg     Casts, Ur, LPF, POC neg     Yeast, UA neg    POCT URINALYSIS DIPSTICK      Result Value Ref Range   Color, UA orange     Clarity, UA cloudy     Glucose, UA 100     Bilirubin, UA small     Ketones, UA neg  Spec Grav, UA 1.010     Blood, UA large     pH, UA 5.0     Protein, UA 100     Urobilinogen, UA 2.0     Nitrite, UA positive     Leukocytes, UA large (3+)     A/P: Frequent urination - Plan: POCT UA - Microscopic Only, POCT urinalysis dipstick, Urine culture, POCT urinalysis dipstick  Painful urination - Plan: POCT UA - Microscopic Only, POCT urinalysis dipstick, Urine culture  UTI (urinary tract infection) - Plan: POCT urinalysis dipstick  Meds ordered this encounter  Medications  . cefUROXime (CEFTIN) 500 MG tablet    Sig: Take 1 tablet (500 mg total) by mouth 2 (two) times daily with a meal.    Dispense:  20 tablet    Refill:  0   Office notes and urine culture results will need to be faxed to [facility]. Pt will RTC in 1st week of June for "lab only" visit for post-treatment UA. This will need to be faxed to urology clinic  also.

## 2014-03-04 LAB — URINE CULTURE: Colony Count: 55000

## 2014-03-05 ENCOUNTER — Other Ambulatory Visit: Payer: Self-pay | Admitting: Family Medicine

## 2014-03-05 ENCOUNTER — Telehealth: Payer: Self-pay | Admitting: Radiology

## 2014-03-05 NOTE — Telephone Encounter (Signed)
Message copied by Caffie Damme on Tue Mar 05, 2014  8:42 AM ------      Message from: Maurice March      Created: Fri Mar 01, 2014  5:59 PM       Please fax office visit note for 03/01/2014 and results of urine culture (when it is final) to  [facility]- Dr. Lianne Moris.            Thanks. ------

## 2014-03-05 NOTE — Telephone Encounter (Signed)
faxed

## 2014-03-05 NOTE — Progress Notes (Signed)
Quick Note:  Please advise pt regarding following labs... Urine culture shows a Staph species not sensitive to the medication you are taking. I am changing your antibiotic to a Sulfa drug which should treat the infection. Please follow-up with Dr. Berneice Heinrich as soon as you can. ______

## 2014-03-06 ENCOUNTER — Telehealth: Payer: Self-pay | Admitting: Family Medicine

## 2014-03-06 ENCOUNTER — Telehealth: Payer: Self-pay | Admitting: Radiology

## 2014-03-06 ENCOUNTER — Ambulatory Visit: Payer: BC Managed Care – PPO | Admitting: Family Medicine

## 2014-03-06 NOTE — Telephone Encounter (Signed)
Message copied by Caffie Damme on Wed Mar 06, 2014 12:49 PM ------      Message from: Maurice March      Created: Tue Mar 05, 2014  7:45 PM       Pt should continue the antibiotic she is taking and contact Dr. Berneice Heinrich about infection. She is allergic to SULFA so I cannot prescribe that antibiotic though the culture shows the organism to sensitive to that antibiotic.            Thank you. ------

## 2014-03-06 NOTE — Telephone Encounter (Signed)
Thanks, I have left message for patient. Faxed results to Dr Berneice Heinrich

## 2014-03-06 NOTE — Telephone Encounter (Signed)
LMOM to call back

## 2014-03-06 NOTE — Telephone Encounter (Signed)
Message copied by Gerrianne Scale on Wed Mar 06, 2014  9:06 AM ------      Message from: Maurice March      Created: Tue Mar 05, 2014  7:45 PM       Pt should continue the antibiotic she is taking and contact Dr. Berneice Heinrich about infection. She is allergic to SULFA so I cannot prescribe that antibiotic though the culture shows the organism to sensitive to that antibiotic.            Thank you. ------

## 2014-03-14 ENCOUNTER — Encounter (HOSPITAL_BASED_OUTPATIENT_CLINIC_OR_DEPARTMENT_OTHER): Payer: Self-pay | Admitting: *Deleted

## 2014-03-14 ENCOUNTER — Other Ambulatory Visit (INDEPENDENT_AMBULATORY_CARE_PROVIDER_SITE_OTHER): Payer: PRIVATE HEALTH INSURANCE

## 2014-03-14 DIAGNOSIS — N390 Urinary tract infection, site not specified: Secondary | ICD-10-CM

## 2014-03-14 DIAGNOSIS — R350 Frequency of micturition: Secondary | ICD-10-CM

## 2014-03-14 LAB — POCT URINALYSIS DIPSTICK
Bilirubin, UA: NEGATIVE
Glucose, UA: NEGATIVE
Ketones, UA: NEGATIVE
NITRITE UA: NEGATIVE
Spec Grav, UA: >=1.03
UROBILINOGEN UA: 0.2
pH, UA: 5.5

## 2014-03-14 LAB — POCT UA - MICROSCOPIC ONLY
Casts, Ur, LPF, POC: NEGATIVE
Crystals, Ur, HPF, POC: NEGATIVE
Mucus, UA: NEGATIVE
YEAST UA: NEGATIVE

## 2014-03-14 NOTE — Progress Notes (Signed)
NPO AFTER MN. ARRIVE AT 0800. NEEDS ISTAT 8.  CURRENT EKG IN CHART AND EPIC. WILL TAKE METOPROLOL, SYNTHROID, AND PRAVACHOL AM DOS W/ SIPS OF WATER.

## 2014-03-16 LAB — URINE CULTURE

## 2014-03-20 ENCOUNTER — Ambulatory Visit (HOSPITAL_BASED_OUTPATIENT_CLINIC_OR_DEPARTMENT_OTHER)
Admission: RE | Admit: 2014-03-20 | Discharge: 2014-03-20 | Disposition: A | Discharge status: Home / Self Care | Payer: No Typology Code available for payment source | Source: Ambulatory Visit | Attending: Urology | Admitting: Urology

## 2014-03-20 ENCOUNTER — Encounter (HOSPITAL_BASED_OUTPATIENT_CLINIC_OR_DEPARTMENT_OTHER): Payer: No Typology Code available for payment source | Admitting: Anesthesiology

## 2014-03-20 ENCOUNTER — Encounter (HOSPITAL_BASED_OUTPATIENT_CLINIC_OR_DEPARTMENT_OTHER)
Admission: RE | Disposition: A | Discharge status: Home / Self Care | Payer: Self-pay | Source: Ambulatory Visit | Attending: Urology

## 2014-03-20 ENCOUNTER — Encounter (HOSPITAL_BASED_OUTPATIENT_CLINIC_OR_DEPARTMENT_OTHER): Payer: Self-pay | Admitting: *Deleted

## 2014-03-20 ENCOUNTER — Ambulatory Visit (HOSPITAL_BASED_OUTPATIENT_CLINIC_OR_DEPARTMENT_OTHER): Payer: No Typology Code available for payment source | Admitting: Anesthesiology

## 2014-03-20 DIAGNOSIS — F411 Generalized anxiety disorder: Secondary | ICD-10-CM | POA: Insufficient documentation

## 2014-03-20 DIAGNOSIS — I059 Rheumatic mitral valve disease, unspecified: Secondary | ICD-10-CM | POA: Insufficient documentation

## 2014-03-20 DIAGNOSIS — E785 Hyperlipidemia, unspecified: Secondary | ICD-10-CM | POA: Insufficient documentation

## 2014-03-20 DIAGNOSIS — E039 Hypothyroidism, unspecified: Secondary | ICD-10-CM | POA: Insufficient documentation

## 2014-03-20 DIAGNOSIS — I10 Essential (primary) hypertension: Secondary | ICD-10-CM | POA: Insufficient documentation

## 2014-03-20 DIAGNOSIS — N200 Calculus of kidney: Secondary | ICD-10-CM | POA: Insufficient documentation

## 2014-03-20 DIAGNOSIS — N201 Calculus of ureter: Secondary | ICD-10-CM | POA: Insufficient documentation

## 2014-03-20 HISTORY — PX: CYSTOSCOPY WITH RETROGRADE PYELOGRAM, URETEROSCOPY AND STENT PLACEMENT: SHX5789

## 2014-03-20 LAB — POCT I-STAT, CHEM 8
BUN: 18 mg/dL (ref 6–23)
Calcium, Ion: 1.38 mmol/L — ABNORMAL HIGH (ref 1.12–1.23)
Chloride: 103 mEq/L (ref 96–112)
Creatinine, Ser: 0.8 mg/dL (ref 0.50–1.10)
GLUCOSE: 102 mg/dL — AB (ref 70–99)
HEMATOCRIT: 44 % (ref 36.0–46.0)
HEMOGLOBIN: 15 g/dL (ref 12.0–15.0)
POTASSIUM: 4.1 meq/L (ref 3.7–5.3)
SODIUM: 146 meq/L (ref 137–147)
TCO2: 25 mmol/L (ref 0–100)

## 2014-03-20 SURGERY — CYSTOURETEROSCOPY, WITH RETROGRADE PYELOGRAM AND STENT INSERTION
Anesthesia: General | Site: Ureter | Laterality: Left

## 2014-03-20 MED ORDER — CEPHALEXIN 500 MG PO CAPS: 500.0000 mg | ORAL_CAPSULE | Freq: Two times a day (BID) | ORAL | Status: DC

## 2014-03-20 MED ORDER — SENNOSIDES-DOCUSATE SODIUM 8.6-50 MG PO TABS: 1.0000 | ORAL_TABLET | Freq: Two times a day (BID) | ORAL | Status: DC

## 2014-03-20 MED ORDER — DEXTROSE 5 % IV SOLN
5.0000 mg/kg | Freq: Once | INTRAVENOUS | Status: AC
  Administered 2014-03-20: 480 mg via INTRAVENOUS
  Filled 2014-03-20: qty 12

## 2014-03-20 MED ORDER — KETOROLAC TROMETHAMINE 30 MG/ML IJ SOLN
INTRAMUSCULAR | Status: DC | PRN
  Administered 2014-03-20: 30 mg via INTRAVENOUS

## 2014-03-20 MED ORDER — FENTANYL CITRATE 0.05 MG/ML IJ SOLN
INTRAMUSCULAR | Status: DC | PRN
  Administered 2014-03-20 (×2): 100 ug via INTRAVENOUS

## 2014-03-20 MED ORDER — ACETAMINOPHEN 10 MG/ML IV SOLN
INTRAVENOUS | Status: DC | PRN
  Administered 2014-03-20: 1000 mg via INTRAVENOUS

## 2014-03-20 MED ORDER — LIDOCAINE HCL (CARDIAC) 20 MG/ML IV SOLN
INTRAVENOUS | Status: DC | PRN
Start: 2014-03-20 — End: 2014-03-20
  Administered 2014-03-20: 80 mg via INTRAVENOUS

## 2014-03-20 MED ORDER — IOHEXOL 350 MG/ML SOLN
INTRAVENOUS | Status: DC | PRN
  Administered 2014-03-20: 19 mL

## 2014-03-20 MED ORDER — ONDANSETRON HCL 4 MG/2ML IJ SOLN
INTRAMUSCULAR | Status: DC | PRN
  Administered 2014-03-20: 4 mg via INTRAVENOUS

## 2014-03-20 MED ORDER — PROPOFOL INFUSION 10 MG/ML OPTIME
INTRAVENOUS | Status: DC | PRN
  Administered 2014-03-20: 50 mL via INTRAVENOUS
  Administered 2014-03-20: 150 mL via INTRAVENOUS
  Administered 2014-03-20: 30 mL via INTRAVENOUS

## 2014-03-20 MED ORDER — FENTANYL CITRATE 0.05 MG/ML IJ SOLN
INTRAMUSCULAR | Status: AC
  Filled 2014-03-20: qty 4

## 2014-03-20 MED ORDER — OXYCODONE-ACETAMINOPHEN 5-325 MG PO TABS: 1.0000 | ORAL_TABLET | Freq: Four times a day (QID) | ORAL | Status: DC | PRN

## 2014-03-20 MED ORDER — LACTATED RINGERS IV SOLN
INTRAVENOUS | Status: DC
  Filled 2014-03-20: qty 1000

## 2014-03-20 MED ORDER — EPHEDRINE SULFATE 50 MG/ML IJ SOLN
INTRAMUSCULAR | Status: DC | PRN
  Administered 2014-03-20: 10 mg via INTRAVENOUS

## 2014-03-20 MED ORDER — DEXAMETHASONE SODIUM PHOSPHATE 10 MG/ML IJ SOLN
INTRAMUSCULAR | Status: DC | PRN
  Administered 2014-03-20: 10 mg via INTRAVENOUS

## 2014-03-20 MED ORDER — FENTANYL CITRATE 0.05 MG/ML IJ SOLN
25.0000 ug | INTRAMUSCULAR | Status: DC | PRN
  Filled 2014-03-20: qty 1

## 2014-03-20 MED ORDER — GENTAMICIN IN SALINE 1.6-0.9 MG/ML-% IV SOLN
80.0000 mg | INTRAVENOUS | Status: DC
  Filled 2014-03-20: qty 50

## 2014-03-20 MED ORDER — MIDAZOLAM HCL 5 MG/5ML IJ SOLN
INTRAMUSCULAR | Status: DC | PRN
  Administered 2014-03-20: 2 mg via INTRAVENOUS

## 2014-03-20 MED ORDER — LACTATED RINGERS IV SOLN
INTRAVENOUS | Status: DC
  Administered 2014-03-20 (×2): via INTRAVENOUS
  Filled 2014-03-20: qty 1000

## 2014-03-20 MED ORDER — SODIUM CHLORIDE 0.9 % IR SOLN
Status: DC | PRN
  Administered 2014-03-20: 6000 mL

## 2014-03-20 MED ORDER — MIDAZOLAM HCL 2 MG/2ML IJ SOLN
INTRAMUSCULAR | Status: AC
  Filled 2014-03-20: qty 2

## 2014-03-20 SURGICAL SUPPLY — 37 items
BAG DRAIN URO-CYSTO SKYTR STRL (DRAIN) ×4 IMPLANT
BASKET LASER NITINOL 1.9FR (BASKET) ×4 IMPLANT
BASKET STNLS GEMINI 4WIRE 3FR (BASKET) IMPLANT
BASKET ZERO TIP NITINOL 2.4FR (BASKET) IMPLANT
BSKT STON RTRVL ZERO TP 2.4FR (BASKET)
CANISTER SUCT LVC 12 LTR MEDI- (MISCELLANEOUS) ×4 IMPLANT
CATH INTERMIT  6FR 70CM (CATHETERS) ×4 IMPLANT
CATH URET 5FR 28IN CONE TIP (BALLOONS)
CATH URET 5FR 28IN OPEN ENDED (CATHETERS) IMPLANT
CATH URET 5FR 70CM CONE TIP (BALLOONS) IMPLANT
CLOTH BEACON ORANGE TIMEOUT ST (SAFETY) ×4 IMPLANT
DRAPE CAMERA CLOSED 9X96 (DRAPES) ×4 IMPLANT
ELECT REM PT RETURN 9FT ADLT (ELECTROSURGICAL)
ELECTRODE REM PT RTRN 9FT ADLT (ELECTROSURGICAL) IMPLANT
FIBER LASER FLEXIVA 200 (UROLOGICAL SUPPLIES) IMPLANT
FIBER LASER FLEXIVA 365 (UROLOGICAL SUPPLIES) IMPLANT
GLOVE BIO SURGEON STRL SZ7.5 (GLOVE) ×4 IMPLANT
GOWN PREVENTION PLUS LG XLONG (DISPOSABLE) IMPLANT
GOWN STRL REIN XL XLG (GOWN DISPOSABLE) IMPLANT
GOWN STRL REUS W/ TWL XL LVL3 (GOWN DISPOSABLE) ×4 IMPLANT
GOWN STRL REUS W/TWL XL LVL3 (GOWN DISPOSABLE) ×6
GUIDEWIRE 0.038 PTFE COATED (WIRE) IMPLANT
GUIDEWIRE ANG ZIPWIRE 038X150 (WIRE) ×4 IMPLANT
GUIDEWIRE STR DUAL SENSOR (WIRE) ×4 IMPLANT
IV NS IRRIG 3000ML ARTHROMATIC (IV SOLUTION) ×8 IMPLANT
KIT BALLIN UROMAX 15FX10 (LABEL) IMPLANT
KIT BALLN UROMAX 15FX4 (MISCELLANEOUS) IMPLANT
KIT BALLN UROMAX 26 75X4 (MISCELLANEOUS)
PACK CYSTOSCOPY (CUSTOM PROCEDURE TRAY) ×4 IMPLANT
SET HIGH PRES BAL DIL (LABEL)
SHEATH ACCESS URETERAL 24CM (SHEATH) ×4 IMPLANT
SHEATH URET ACCESS 12FR/35CM (UROLOGICAL SUPPLIES) IMPLANT
SHEATH URET ACCESS 12FR/55CM (UROLOGICAL SUPPLIES) IMPLANT
STENT POLARIS 5FRX24 (STENTS) ×4 IMPLANT
SYRINGE 10CC LL (SYRINGE) ×4 IMPLANT
SYRINGE IRR TOOMEY STRL 70CC (SYRINGE) IMPLANT
TUBE FEEDING 8FR 16IN STR KANG (MISCELLANEOUS) IMPLANT

## 2014-03-20 NOTE — Transfer of Care (Signed)
Immediate Anesthesia Transfer of Care Note  Patient: Ashley Stevenson  Procedure(s) Performed: Procedure(s): CYSTOSCOPY WITH RETROGRADE PYELOGRAM, URETEROSCOPY AND STENT EXCHANGE (Left)  Patient Location: PACU  Anesthesia Type:General  Level of Consciousness: awake, alert , oriented and patient cooperative  Airway & Oxygen Therapy: Patient Spontanous Breathing and Patient connected to nasal cannula oxygen  Post-op Assessment: Report given to PACU RN and Post -op Vital signs reviewed and stable  Post vital signs: Reviewed and stable  Complications: No apparent anesthesia complications

## 2014-03-20 NOTE — Anesthesia Preprocedure Evaluation (Addendum)
Anesthesia Evaluation  Patient identified by MRN, date of birth, ID band Patient awake    Reviewed: Allergy & Precautions, H&P , NPO status , Patient's Chart, lab work & pertinent test results  Airway Mallampati: II TM Distance: >3 FB Neck ROM: full    Dental no notable dental hx. (+) Teeth Intact, Dental Advisory Given   Pulmonary neg pulmonary ROS,  breath sounds clear to auscultation  Pulmonary exam normal       Cardiovascular Exercise Tolerance: Good hypertension, Pt. on home beta blockers + dysrhythmias + Valvular Problems/Murmurs MVP Rhythm:regular Rate:Normal  Occasional palpitations   Neuro/Psych Anxiety Claustrophobicnegative neurological ROS  negative psych ROS   GI/Hepatic negative GI ROS, Neg liver ROS,   Endo/Other  negative endocrine ROSHypothyroidism   Renal/GU Renal diseasenegative Renal ROS  negative genitourinary   Musculoskeletal   Abdominal   Peds  Hematology negative hematology ROS (+)   Anesthesia Other Findings   Reproductive/Obstetrics negative OB ROS                       Anesthesia Physical Anesthesia Plan  ASA: II  Anesthesia Plan: General   Post-op Pain Management:    Induction: Intravenous  Airway Management Planned: LMA  Additional Equipment:   Intra-op Plan:   Post-operative Plan:   Informed Consent: I have reviewed the patients History and Physical, chart, labs and discussed the procedure including the risks, benefits and alternatives for the proposed anesthesia with the patient or authorized representative who has indicated his/her understanding and acceptance.   Dental Advisory Given  Plan Discussed with: CRNA and Surgeon  Anesthesia Plan Comments:         Anesthesia Quick Evaluation

## 2014-03-20 NOTE — Discharge Instructions (Signed)
1 - You may have urinary urgency (bladder spasms) and bloody urine on / off with stent in place. This is normal.  2 - Call MD or go to ER for fever >102, severe pain / nausea / vomiting not relieved by medications, or acute change in medical status  3 - Remove tethered stent on Friday morning by pulling on string, then blue/white plastic tubing and discarding.    Post Anesthesia Home Care Instructions  Activity: Get plenty of rest for the remainder of the day. A responsible adult should stay with you for 24 hours following the procedure.  For the next 24 hours, DO NOT: -Drive a car -Advertising copywriter -Drink alcoholic beverages -Take any medication unless instructed by your physician -Make any legal decisions or sign important papers.  Meals: Start with liquid foods such as gelatin or soup. Progress to regular foods as tolerated. Avoid greasy, spicy, heavy foods. If nausea and/or vomiting occur, drink only clear liquids until the nausea and/or vomiting subsides. Call your physician if vomiting continues.  Special Instructions/Symptoms: Your throat may feel dry or sore from the anesthesia or the breathing tube placed in your throat during surgery. If this causes discomfort, gargle with warm salt water. The discomfort should disappear within 24 hours.

## 2014-03-20 NOTE — Anesthesia Procedure Notes (Signed)
Procedure Name: LMA Insertion Date/Time: 03/20/2014 9:46 AM Performed by: Tyrone Nine Pre-anesthesia Checklist: Patient identified, Timeout performed, Emergency Drugs available, Suction available and Patient being monitored Patient Re-evaluated:Patient Re-evaluated prior to inductionOxygen Delivery Method: Circle system utilized Preoxygenation: Pre-oxygenation with 100% oxygen Intubation Type: IV induction Ventilation: Mask ventilation without difficulty LMA: LMA inserted LMA Size: 4.0 Airway Equipment and Method: Bite block Placement Confirmation: positive ETCO2 Tube secured with: Tape Dental Injury: Teeth and Oropharynx as per pre-operative assessment

## 2014-03-20 NOTE — Anesthesia Postprocedure Evaluation (Signed)
  Anesthesia Post-op Note  Patient: Ashley Stevenson  Procedure(s) Performed: Procedure(s) (LRB): CYSTOSCOPY WITH RETROGRADE PYELOGRAM, URETEROSCOPY AND STENT EXCHANGE (Left)  Patient Location: PACU  Anesthesia Type: General  Level of Consciousness: awake and alert   Airway and Oxygen Therapy: Patient Spontanous Breathing  Post-op Pain: mild  Post-op Assessment: Post-op Vital signs reviewed, Patient's Cardiovascular Status Stable, Respiratory Function Stable, Patent Airway and No signs of Nausea or vomiting  Last Vitals:  Filed Vitals:   03/20/14 1100  BP: 118/48  Pulse: 64  Temp:   Resp: 18    Post-op Vital Signs: stable   Complications: No apparent anesthesia complications

## 2014-03-20 NOTE — Brief Op Note (Signed)
03/20/2014  10:25 AM  PATIENT:  Ashley Stevenson  58 y.o. female  PRE-OPERATIVE DIAGNOSIS:  LEFT URETERAL STONE  POST-OPERATIVE DIAGNOSIS:  * No post-op diagnosis entered *  PROCEDURE:  Cysto, left retrograde, left ureteral stent exchange, basket left ureteral stone  SURGEON:  Surgeon(s) and Role:    * Sebastian Ache, MD - Primary  PHYSICIAN ASSISTANT:   ASSISTANTS: none   ANESTHESIA:   general  EBL:  Total I/O In: 750 [I.V.:750] Out: -   BLOOD ADMINISTERED:none  DRAINS: none   LOCAL MEDICATIONS USED:  NONE  SPECIMEN:  Source of Specimen:  Left ureteral stone  DISPOSITION OF SPECIMEN:  [facility] for compositional analysis  COUNTS:  YES  TOURNIQUET:  * No tourniquets in log *  DICTATION: .Other Dictation: Dictation Number [id number]  PLAN OF CARE: Discharge to home after PACU  PATIENT DISPOSITION:  PACU - hemodynamically stable.   Delay start of Pharmacological VTE agent (>24hrs) due to surgical blood loss or risk of bleeding: not applicable

## 2014-03-20 NOTE — H&P (Signed)
Ashley Stevenson is an 58 y.o. female.    Chief Complaint: Pre-Op Left Ureteroscopic Stone Manipulation  HPI:     1 - Recurrent Nerpholithiasis -  Pre 2015 one episode of passage with medical therapy early 1980s. 02/2014 - Left 28mm prox ureteral (650HU, SSD 13cm) and 73mm intrarenal by ER CT of eval left flank pain.   Today Ashley Stevenson is seen to proceed with re-attempt left ureteroscopoic stone manipulation. She underwent 1st stage procedure 02/14/14 at which time her ureter was quite narrow and would not accomodate ureteroscope to level of stone, therefore stent placed to allow for interval passive dilation. Most recent UCX negative. No interval fevers.   Past Medical History  Diagnosis Date  . Hyperlipidemia   . Hypertension   . Anxiety   . Hypothyroidism   . Left ureteral calculus   . Left nephrolithiasis   . History of kidney stones   . MVP (mitral valve prolapse)     per pt  dx 1970's with no in echo severe yrs--  occasional palpitation  . 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 First degree heart block     Past Surgical History  Procedure Laterality Date  . Tonsillectomy and adenoidectomy  age 33  . Mandible surgery Bilateral 1999    upper and lower retain hardware (for osteoarthritis)  . Strabismus surgery Bilateral age 18  . Tubal ligation  1995  . Appendectomy  1987  . Cystoscopy with retrograde pyelogram, ureteroscopy and stent placement Left 02/14/2014    Procedure: CYSTOSCOPY WITH RETROGRADE PYELOGRAM,  DIAGNOSTIC URETEROSCOPY AND STENT PLACEMENT;  Surgeon: Ashley Ache, MD;  Location: [facility];  Service: Urology;  Laterality: Left;    Family History  Problem Relation Age of Onset  . Heart disease Mother   . Hyperlipidemia Mother   . Hypertension Mother   . Hyperlipidemia Father   . Hypertension Sister   . Cancer Sister   .  Hypertension Sister   . Cancer Sister   . Hypertension Sister   . Hypertension Sister   . Hypertension Sister   . Hypertension Sister    Social History:  reports that she has never smoked. She has never used smokeless tobacco. She reports that she does not drink alcohol or use illicit drugs.  Allergies:  Allergies  Allergen Reactions  . Benazepril Hcl Rash  . Tessalon Perles Other (See Comments)    "bad feeling"  . Sulfa Antibiotics Rash    No prescriptions prior to admission    No results found for this or any previous visit (from the past 48 hour(s)). No results found.  Review of Systems  Constitutional: Negative.  Negative for fever and chills.  HENT: Negative.   Eyes: Negative.   Respiratory: Negative.   Cardiovascular: Negative.   Gastrointestinal: Negative.   Genitourinary: Negative.   Musculoskeletal: Negative.   Skin: Negative.   Neurological: Negative.   Endo/Heme/Allergies: Negative.   Psychiatric/Behavioral: Negative.     There were no vitals taken for this visit. Physical Exam  Constitutional: She is oriented to person, place, and time. She appears well-developed and well-nourished.  HENT:  Head: Normocephalic and atraumatic.  Eyes: Pupils are equal, round, and reactive to light.  Neck: Normal range of motion. Neck supple.  Cardiovascular: Normal rate.   Respiratory: Effort normal.  GI: Soft. Bowel  sounds are normal.  Genitourinary:  No CVAT  Musculoskeletal: Normal range of motion.  Neurological: She is alert and oriented to person, place, and time.  Skin: Skin is warm and dry.  Psychiatric: She has a normal mood and affect. Her behavior is normal. Judgment and thought content normal.     Assessment/Plan  1 - Recurrent Nephrolithiasis -  We rediscussed ureteroscopic stone manipulation with basketing and laser-lithotripsy in detail.  We rediscussed risks including bleeding, infection, damage to kidney / ureter  bladder, rarely loss of kidney. We  discussed anesthetic risks and rare but serious surgical complications including DVT, PE, MI, and mortality. We specifically addressed that in 5-10% of cases a staged approach is required with stenting followed by re-attempt ureteroscopy if anatomy unfavorable. The patient voiced understanding and wises to proceed.   Ashley Stevenson 03/20/2014, 5:44 AM

## 2014-03-21 ENCOUNTER — Encounter (HOSPITAL_BASED_OUTPATIENT_CLINIC_OR_DEPARTMENT_OTHER): Payer: Self-pay | Admitting: Urology

## 2014-03-21 NOTE — Op Note (Signed)
NAMEJAKALA, Ashley Stevenson NO.:  [id number]  MEDICAL RECORD NO.:  [id number]  LOCATION:                               FACILITY:  [facility]  PHYSICIAN:  Ashley Ache, MD     DATE OF BIRTH:  [birth year]  DATE OF PROCEDURE: 03/20/2014  DATE OF DISCHARGE:  03/20/2014                              OPERATIVE REPORT   PREOPERATIVE DIAGNOSIS:  Left ureteral and renal stones, history of renal colic.  POSTOPERATIVE DIAGNOSIS:  PROCEDURE: 1. Cystoscopy with left retrograde pyelogram and interpretation. 2. Left ureteroscopy with basketing of stones. 3. Exchange of left ureteral stent 5/24 with tether to the left thigh.  FINDINGS: 1. Widely patent ureter from the bladder to the area of the renal     pelvis. 2. Apparent displacement of prior ureteral stone into the lower pole.     This was very fusiform and amenable to simple basketing and was     removed as such. 3. Small additional renal stone, this was removed and discarded. 4. Unremarkable urinary bladder.  SPECIMEN:  Left ureteral stone for compositional analysis.  INDICATION:  Ashley Stevenson is a pleasant 58 year old lady with recent history of severe left renal colic.  She underwent attempted left ureteroscopic stone manipulation last month, however, her ureter was quite narrow and it would not easily accommodate the ureteroscope.  As such, a stone was most prudent, means of management would be stenting at that time to allow for passive dilation and re-attempt at a later date. She presents for the second time today.  Informed consent was obtained and placed in medical record.  PROCEDURE IN DETAIL:  [name], procedure being left ureteroscopic stone manipulation was confirmed.  Procedure was carried out.  Time-out was performed.  Intravenous antibiotics were administered.  General LMA anesthesia was introduced.  Patient placed into a low lithotomy position and sterile field was created by  prepping and draping the patient's vagina, introitus, and proximal thighs using iodine x3.  Next, cystourethroscopy was performed using a 22-French rigid cystoscope with 12-degree offset lens.  Inspection of urinary bladder revealed distal end of the left ureteral stent in situ with minimal encrustation.  This was grasped with cold graspers, brought out in its entirety, set aside for discard.  Next, the left ureteral orifice was cannulated with a 6-French end-hole catheter and left retrograde pyelogram was obtained.  Left retrograde pyelogram, there was a single left ureter, single system left kidney.  There were no filling defects or narrowing noted.  0.038 Glidewire was advanced to the level of the upper pole and set aside as a safety wire.  Next, semi-rigid ureteroscopy was performed of the distal 2/3 left ureter alongside a separate Sensor working wire.  No mucosal abnormalities were found.  The ureter was much more compliant and previously having had successful passive dilation with stent in place. Next, a semi-rigid ureteroscope was exchanged for the 12/14, 24 cm ureteral access sheath at the level of the mid ureter using continuous fluoroscopic guidance.  Next, flexible digital ureteroscopy was performed of the proximal ureter and entire left kidney using 6-French digital ureteroscope.  A previous ureteral stone had apparently been displaced likely  prior to procedure and now right-sided within the lower pole.  This was very fusiform, it appeared to be amenable to attempt at simple basketing.  As such, an escape basket was used to grasp the stone on its long axis and it was easily removed and set aside for compositional analysis.  There was one additional calcification approximately 3 mm in diameter, and this did correspond to prior known intrarenal stone based on her prior CT.  This was also grasped and brought out and set aside.  Repeat systematic inspection of the left kidney  including all calices x2 revealed complete resolution of all stone fragments larger than 1/3 mm.  No evidence of perforation.  There was excellent hemostasis.  The access sheath was removed under continuous ureteroscopic vision and no mucosal abnormalities were found of the ureter.  As an access sheath had been employed, it was felt that an interval of a stenting was once again warranted as such a new 5 x 24 Polaris-type stent was placed over the remaining safety wire using fluoroscopic guidance, good proximal and distal deployments were noted. Tether was left in place and fashioned to thigh.  Bladder was emptied per cystoscope.  Procedure was then terminated.  Please note during all ureteroscopic portions, an 8-French feeding tube was in the urinary bladder for pressure release.          ______________________________ Ashley Acheheodore Taunia Frasco, MD     TM/MEDQ  D:  03/20/2014  T:  03/20/2014  Job:  [id number]

## 2014-03-26 ENCOUNTER — Other Ambulatory Visit: Payer: Self-pay | Admitting: Oncology

## 2014-06-13 ENCOUNTER — Other Ambulatory Visit: Payer: Self-pay | Admitting: Oncology

## 2014-07-03 IMAGING — CT CT ABD-PELV W/O CM
1 series · 15 of 28 positions shown, 19 images · non-contrast
Comparison: None.

CLINICAL DATA: Left-sided flank pain.  History of stones.

EXAM:
CT ABDOMEN AND PELVIS WITHOUT CONTRAST
TECHNIQUE: Multidetector CT imaging of the abdomen and pelvis was performed
following the standard protocol without IV contrast.

[Series 6: lung · axial · 0.79mm/px · z∈[+1806,+1926]mm · 15 of 28 slices shown, 19 images]
[im 3/28  soft-tissue]
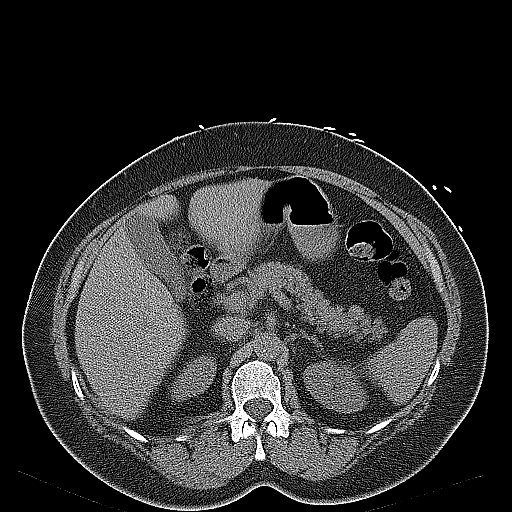
[im 3/28  bone]
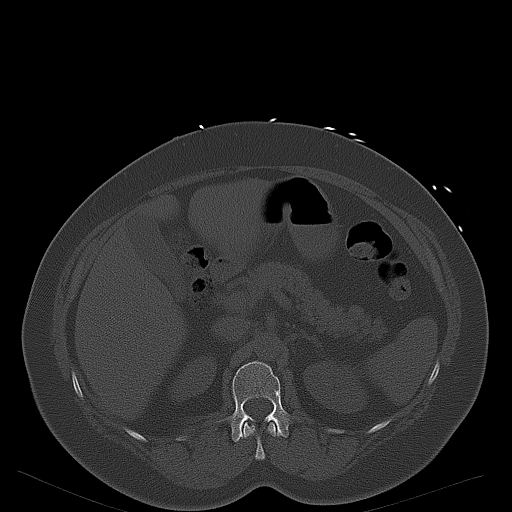
[im 5/28  soft-tissue]
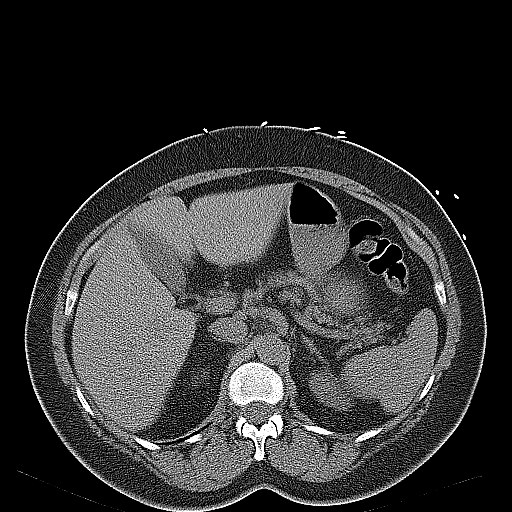
[im 7/28  soft-tissue]
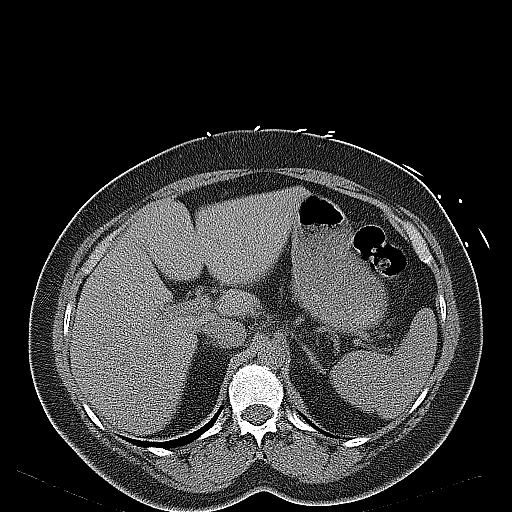
[im 9/28  soft-tissue]
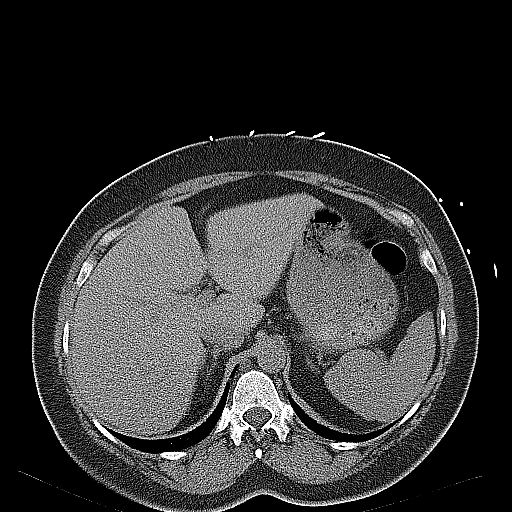
[im 11/28  soft-tissue]
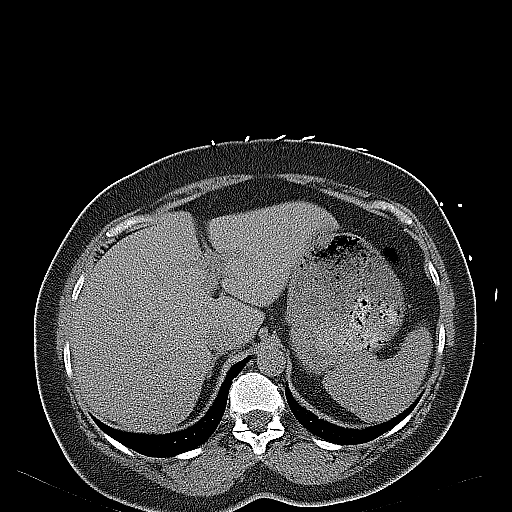
[im 13/28  soft-tissue]
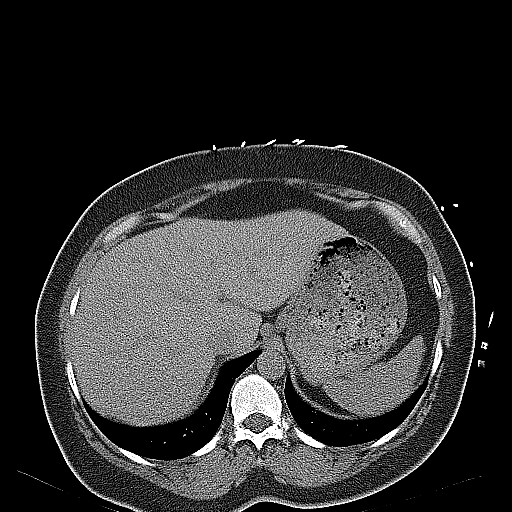
[im 15/28  soft-tissue]
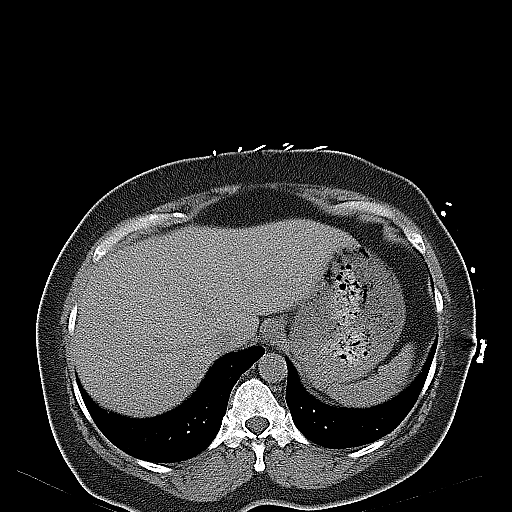
[im 17/28  soft-tissue]
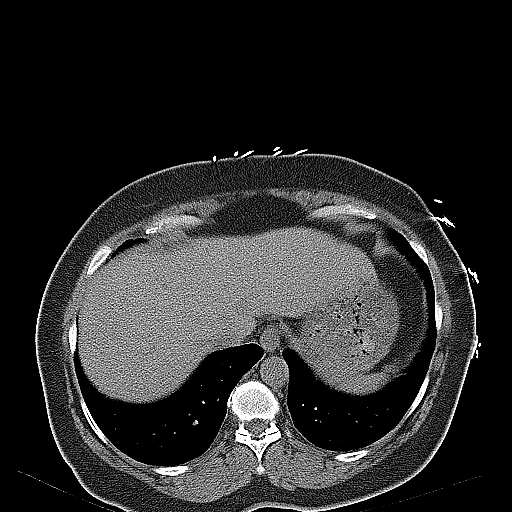
[im 19/28  soft-tissue]
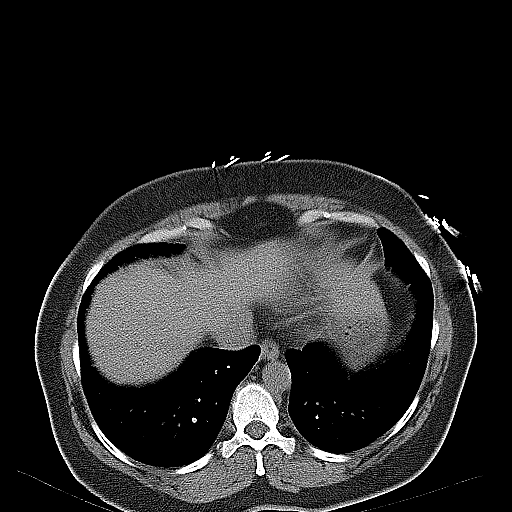
[im 19/28  bone]
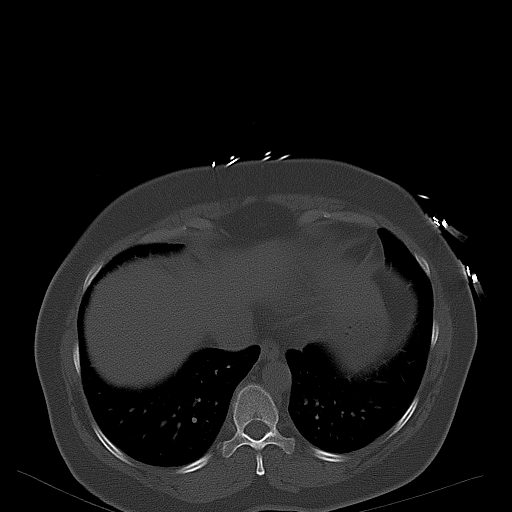
[im 21/28  soft-tissue]
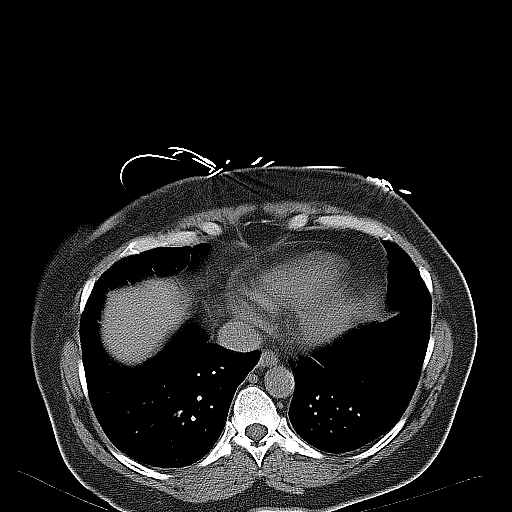
[im 23/28  soft-tissue]
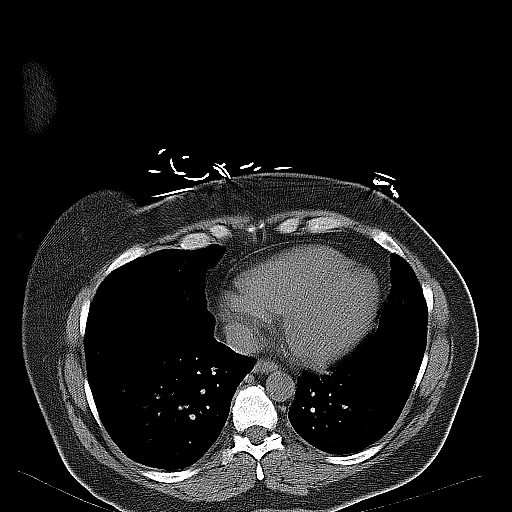
[im 24/28  lung]
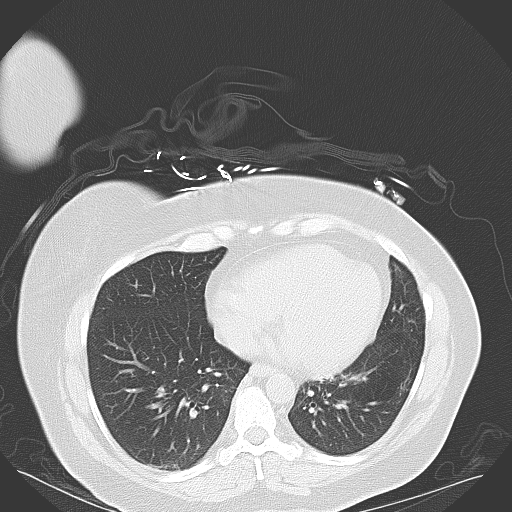
[im 25/28  soft-tissue]
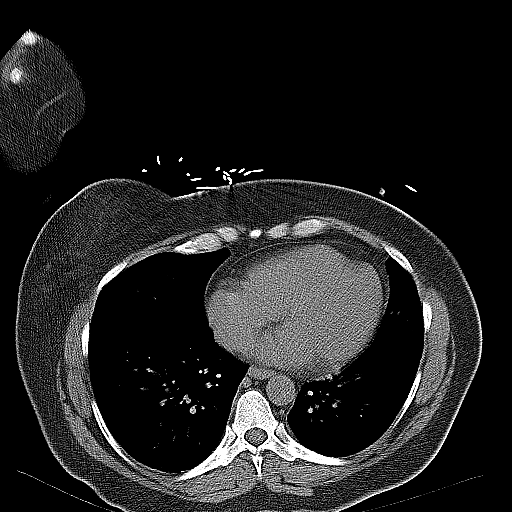
[im 25/28  lung]
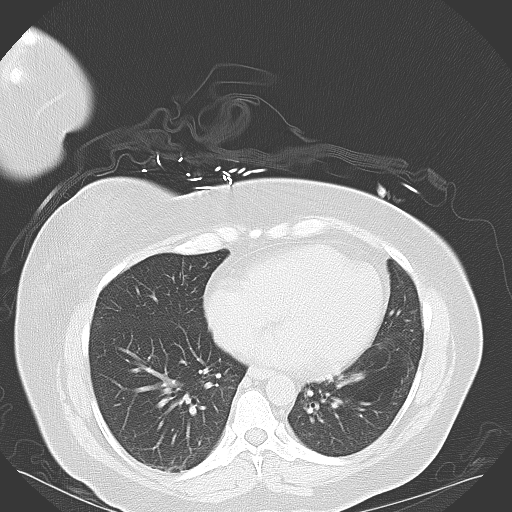
[im 26/28  lung]
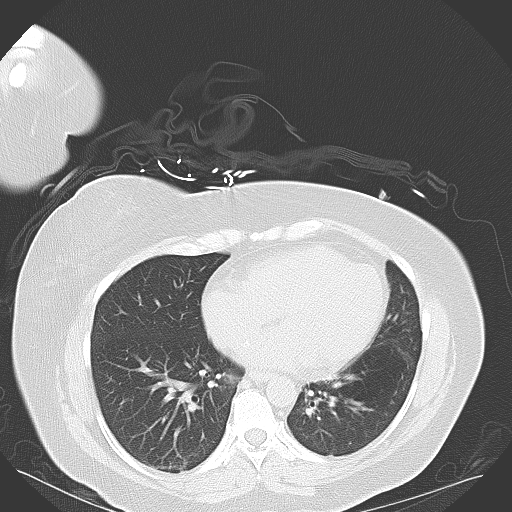
[im 27/28  soft-tissue]
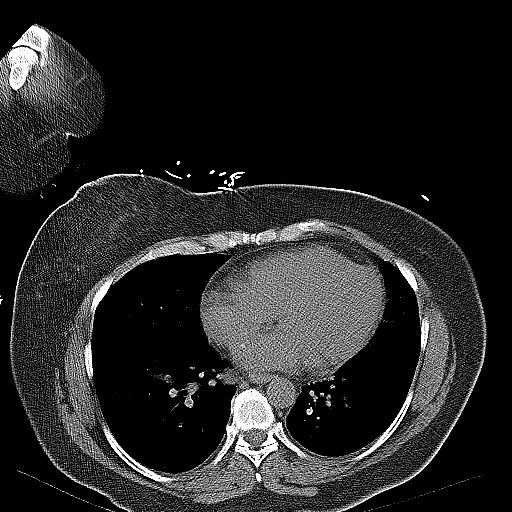
[im 27/28  lung]
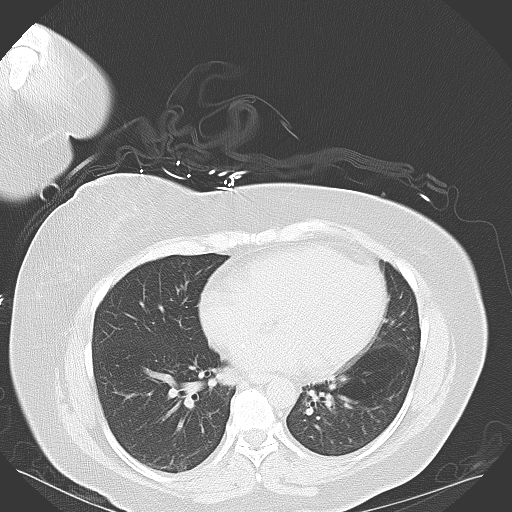

[15 of 28 positions shown; findings below may reference images not displayed]

FINDINGS: BODY WALL: Unremarkable.

LOWER CHEST: Unremarkable.

ABDOMEN/PELVIS:

Liver: 4 mm low-density right lateral of the gallbladder fossa is
too small for accurate density measurement. This is likely an
incidental cyst or other benign process.

Biliary: No evidence of biliary obstruction or stone.

Pancreas: Unremarkable.

Spleen: Unremarkable.

Adrenals: Unremarkable.

Kidneys and ureters: 6 mm stone in the proximal left ureter with
mild hydronephrosis. There is a 4 mm stone in the lower pole left
kidney. No right-sided nephrolithiasis. Calcification near the lower
third of the left ureter has an appearance most typical of
phlebolith.

Bladder: Unremarkable.

Reproductive: Unremarkable.

Bowel: Extensive sigmoid diverticulosis. No pericecal inflammation.

Retroperitoneum: No mass or adenopathy.

Peritoneum: No free fluid or gas.

Vascular: No acute abnormality.

OSSEOUS: No acute abnormalities.
IMPRESSION: 1. Obstructing 6 mm proximal left ureteral stone.
2. Nonobstructive left lower pole nephrolithiasis.
3. Sigmoid diverticulosis.

## 2014-08-22 ENCOUNTER — Ambulatory Visit: Payer: PRIVATE HEALTH INSURANCE | Admitting: Family Medicine

## 2014-11-07 ENCOUNTER — Other Ambulatory Visit: Payer: Self-pay

## 2014-11-07 DIAGNOSIS — I10 Essential (primary) hypertension: Secondary | ICD-10-CM

## 2014-11-07 MED ORDER — LEVOTHYROXINE SODIUM 75 MCG PO TABS: 75.0000 ug | ORAL_TABLET | Freq: Every day | ORAL | Status: DC

## 2014-11-07 MED ORDER — PRAVASTATIN SODIUM 80 MG PO TABS: 80.0000 mg | ORAL_TABLET | Freq: Every morning | ORAL | Status: DC

## 2014-11-07 MED ORDER — METOPROLOL TARTRATE 50 MG PO TABS: 50.0000 mg | ORAL_TABLET | Freq: Two times a day (BID) | ORAL | Status: DC

## 2014-11-07 NOTE — Telephone Encounter (Signed)
Exp scripts faxed back a req for RFs on 3 faxes I had sent back to them w/info of the RFs they should already have on file. I will go ahead and just send in one 90 day RF of each which is what was remaining on Rx written by Dr Audria NineMcPherson on 02/28/14.

## 2014-11-08 ENCOUNTER — Ambulatory Visit (INDEPENDENT_AMBULATORY_CARE_PROVIDER_SITE_OTHER): Payer: PRIVATE HEALTH INSURANCE | Admitting: Family Medicine

## 2014-11-08 ENCOUNTER — Encounter: Payer: Self-pay | Admitting: Family Medicine

## 2014-11-08 VITALS — BP 135/85 | HR 63 | Temp 98.6°F | Resp 16 | Ht 66.75 in | Wt 223.4 lb

## 2014-11-08 DIAGNOSIS — I10 Essential (primary) hypertension: Secondary | ICD-10-CM

## 2014-11-08 DIAGNOSIS — Z23 Encounter for immunization: Secondary | ICD-10-CM

## 2014-11-08 DIAGNOSIS — E038 Other specified hypothyroidism: Secondary | ICD-10-CM

## 2014-11-08 DIAGNOSIS — G250 Essential tremor: Secondary | ICD-10-CM

## 2014-11-08 LAB — COMPREHENSIVE METABOLIC PANEL
ALK PHOS: 50 U/L (ref 39–117)
ALT: 20 U/L (ref 0–35)
AST: 17 U/L (ref 0–37)
Albumin: 4.3 g/dL (ref 3.5–5.2)
BILIRUBIN TOTAL: 0.4 mg/dL (ref 0.2–1.2)
BUN: 15 mg/dL (ref 6–23)
CHLORIDE: 104 meq/L (ref 96–112)
CO2: 26 meq/L (ref 19–32)
Calcium: 9.9 mg/dL (ref 8.4–10.5)
Creat: 0.84 mg/dL (ref 0.50–1.10)
Glucose, Bld: 83 mg/dL (ref 70–99)
Potassium: 4.4 mEq/L (ref 3.5–5.3)
SODIUM: 141 meq/L (ref 135–145)
Total Protein: 7.1 g/dL (ref 6.0–8.3)

## 2014-11-08 MED ORDER — PRIMIDONE 50 MG PO TABS: ORAL_TABLET | ORAL | Status: DC

## 2014-11-08 NOTE — Patient Instructions (Signed)
DASH Eating Plan °DASH stands for "Dietary Approaches to Stop Hypertension." The DASH eating plan is a healthy eating plan that has been shown to reduce high blood pressure (hypertension). Additional health benefits may include reducing the risk of type 2 diabetes mellitus, heart disease, and stroke. The DASH eating plan may also help with weight loss. °WHAT DO I NEED TO KNOW ABOUT THE DASH EATING PLAN? °For the DASH eating plan, you will follow these general guidelines: °· Choose foods with a percent daily value for sodium of less than 5% (as listed on the food label). °· Use salt-free seasonings or herbs instead of table salt or sea salt. °· Check with your health care provider or pharmacist before using salt substitutes. °· Eat lower-sodium products, often labeled as "lower sodium" or "no salt added." °· Eat fresh foods. °· Eat more vegetables, fruits, and low-fat dairy products. °· Choose whole grains. Look for the word "whole" as the first word in the ingredient list. °· Choose fish and skinless chicken or turkey more often than red meat. Limit fish, poultry, and meat to 6 oz (170 g) each day. °· Limit sweets, desserts, sugars, and sugary drinks. °· Choose heart-healthy fats. °· Limit cheese to 1 oz (28 g) per day. °· Eat more home-cooked food and less restaurant, buffet, and fast food. °· Limit fried foods. °· Cook foods using methods other than frying. °· Limit canned vegetables. If you do use them, rinse them well to decrease the sodium. °· When eating at a restaurant, ask that your food be prepared with less salt, or no salt if possible. °WHAT FOODS CAN I EAT? °Seek help from a dietitian for individual calorie needs. °Grains °Whole grain or whole wheat bread. Brown rice. Whole grain or whole wheat pasta. Quinoa, bulgur, and whole grain cereals. Low-sodium cereals. Corn or whole wheat flour tortillas. Whole grain cornbread. Whole grain crackers. Low-sodium crackers. °Vegetables °Fresh or frozen vegetables  (raw, steamed, roasted, or grilled). Low-sodium or reduced-sodium tomato and vegetable juices. Low-sodium or reduced-sodium tomato sauce and paste. Low-sodium or reduced-sodium canned vegetables.  °Fruits °All fresh, canned (in natural juice), or frozen fruits. °Meat and Other Protein Products °Ground beef (85% or leaner), grass-fed beef, or beef trimmed of fat. Skinless chicken or turkey. Ground chicken or turkey. Pork trimmed of fat. All fish and seafood. Eggs. Dried beans, peas, or lentils. Unsalted nuts and seeds. Unsalted canned beans. °Dairy °Low-fat dairy products, such as skim or 1% milk, 2% or reduced-fat cheeses, low-fat ricotta or cottage cheese, or plain low-fat yogurt. Low-sodium or reduced-sodium cheeses. °Fats and Oils °Tub margarines without trans fats. Light or reduced-fat mayonnaise and salad dressings (reduced sodium). Avocado. Safflower, olive, or canola oils. Natural peanut or almond butter. °Other °Unsalted popcorn and pretzels. °The items listed above may not be a complete list of recommended foods or beverages. Contact your dietitian for more options. °WHAT FOODS ARE NOT RECOMMENDED? °Grains °White bread. White pasta. White rice. Refined cornbread. Bagels and croissants. Crackers that contain trans fat. °Vegetables °Creamed or fried vegetables. Vegetables in a cheese sauce. Regular canned vegetables. Regular canned tomato sauce and paste. Regular tomato and vegetable juices. °Fruits °Dried fruits. Canned fruit in light or heavy syrup. Fruit juice. °Meat and Other Protein Products °Fatty cuts of meat. Ribs, chicken wings, bacon, sausage, bologna, salami, chitterlings, fatback, hot dogs, bratwurst, and packaged luncheon meats. Salted nuts and seeds. Canned beans with salt. °Dairy °Whole or 2% milk, cream, half-and-half, and cream cheese. Whole-fat or sweetened yogurt. Full-fat   cheeses or blue cheese. Nondairy creamers and whipped toppings. Processed cheese, cheese spreads, or cheese  curds. °Condiments °Onion and garlic salt, seasoned salt, table salt, and sea salt. Canned and packaged gravies. Worcestershire sauce. Tartar sauce. Barbecue sauce. Teriyaki sauce. Soy sauce, including reduced sodium. Steak sauce. Fish sauce. Oyster sauce. Cocktail sauce. Horseradish. Ketchup and mustard. Meat flavorings and tenderizers. Bouillon cubes. Hot sauce. Tabasco sauce. Marinades. Taco seasonings. Relishes. °Fats and Oils °Butter, stick margarine, lard, shortening, ghee, and bacon fat. Coconut, palm kernel, or palm oils. Regular salad dressings. °Other °Pickles and olives. Salted popcorn and pretzels. °The items listed above may not be a complete list of foods and beverages to avoid. Contact your dietitian for more information. °WHERE CAN I FIND MORE INFORMATION? °National [employer], [employer], [employer] Institute: www.nhlbi.nih.gov/health/health-topics/topics/dash/ °Document Released: 09/16/2011 Document Revised: 02/11/2014 Document Reviewed: 08/01/2013 °ExitCare® Patient Information ©2015 [employer]. This information is not intended to replace advice given to you by your health care provider. Make sure you discuss any questions you have with your health care provider. ° °

## 2014-11-09 LAB — T4, FREE: Free T4: 1.18 ng/dL (ref 0.80–1.80)

## 2014-11-09 LAB — TSH: TSH: 3.806 u[IU]/mL (ref 0.350–4.500)

## 2014-11-11 NOTE — Progress Notes (Signed)
Subjective:    Patient ID: Ashley Stevenson, female    DOB: 09-Apr-1956, 59 y.o.   MRN: [id number]  HPI  This 59 y.o. Female  Is here for follow-up of hypothyroidism and HTN; she is compliant and stable on current medications. Pt denies fatigue, abnormal weight change, diaphoresis, vision disturbances, CP or tightness, palpitations, SOB or DOE, edema, changes in bowel habits, skin changes or rashes, HA, dizziness, numbness, weakness or syncope. Her mood is stable; she has some mild anxiety re: financial status. Last visit w/ me was May 2015.   Since that visit, pt has undergone 2 urologic procedures due to kidney stones. Pt had stent placement x 2 and reports both procedures were extremely painful. After last procedure, pt resorted to removing stent herself; stent extended from ureter down through bladder into urethra. She could not function w/o pain; narcotic pain medication did not provide relief and she limited use of the medication because she was trying to work during recovery. She pulled the stent herself 2 weeks ahead of time. Currently, she is pain-free and is making payments on her hospital bill which exceeds $30,000.  Pt has a benign familial tremor of both hands; this appears to slowly progressing. Neurological evaluation several years ago confirmed that this is a hereditary condition. Pt inquires about medication to treat this. People have commented on the shaking in her hands and she has increasing self-consciousness.  Patient Active Problem List   Diagnosis Date Noted  . Ureteral stone 03/01/2014  . Hyperlipidemia   . Hypertension   . Hypothyroidism   . Anxiety    Prior to Admission medications   Medication Sig Start Date End Date Taking? Authorizing Provider  aspirin 81 MG tablet Take 81 mg by mouth 2 (two) times daily.    Yes Dois Davenport, MD  fish oil-omega-3 fatty acids 1000 MG capsule Take 2 g by mouth daily.   Yes Historical Provider, MD  levothyroxine (SYNTHROID,  LEVOTHROID) 75 MCG tablet Take 1 tablet (75 mcg total) by mouth daily before breakfast.   Yes Maurice March, MD  metoprolol (LOPRESSOR) 50 MG tablet Take 1 tablet (50 mg total) by mouth 2 (two) times daily.   Yes Maurice March, MD  Multiple Vitamin (MULTIVITAMIN) tablet Take 1 tablet by mouth daily.   Yes Historical Provider, MD  pravastatin (PRAVACHOL) 80 MG tablet Take 1 tablet (80 mg total) by mouth every morning.   Yes Maurice March, MD       Maurice March, MD   Past Surgical History  Procedure Laterality Date  . Tonsillectomy and adenoidectomy  age 49  . Mandible surgery Bilateral 1999    upper and lower retain hardware (for osteoarthritis)  . Strabismus surgery Bilateral age 61  . Tubal ligation  1995  . Appendectomy  1987  . Cystoscopy with retrograde pyelogram, ureteroscopy and stent placement Left 02/14/2014    Procedure: CYSTOSCOPY WITH RETROGRADE PYELOGRAM,  DIAGNOSTIC URETEROSCOPY AND STENT PLACEMENT;  Surgeon: Sebastian Ache, MD;  Location: [facility];  Service: Urology;  Laterality: Left;  . Cystoscopy with retrograde pyelogram, ureteroscopy and stent placement Left 03/20/2014    Procedure: CYSTOSCOPY WITH RETROGRADE PYELOGRAM, URETEROSCOPY AND STENT EXCHANGE;  Surgeon: Sebastian Ache, MD;  Location: [facility];  Service: Urology;  Laterality: Left;    SOC and FAM Hx reviewed.   Review of Systems  Constitutional: Negative.   Eyes: Negative.   Respiratory: Negative.   Cardiovascular: Negative.   Gastrointestinal: Negative.  Genitourinary: Negative.   Musculoskeletal: Negative.   Skin: Negative.   Neurological: Negative.   Psychiatric/Behavioral: Negative for sleep disturbance and dysphoric mood. The patient is nervous/anxious.        Objective:   Physical Exam  Constitutional: She is oriented to person, place, and time. She appears well-developed and well-nourished. No distress.  HENT:  Head: Normocephalic and  atraumatic.  Right Ear: External ear normal.  Left Ear: External ear normal.  Nose: Nose normal.  Mouth/Throat: Oropharynx is clear and moist.  Eyes: Conjunctivae and EOM are normal. Pupils are equal, round, and reactive to light. No scleral icterus.  Neck: Normal range of motion. Neck supple.  Cardiovascular: Normal rate, regular rhythm and normal heart sounds.   Pulmonary/Chest: Effort normal and breath sounds normal. No respiratory distress.  Musculoskeletal: Normal range of motion. She exhibits no edema or tenderness.  Neurological: She is alert and oriented to person, place, and time. She has normal strength. She displays tremor. No cranial nerve deficit or sensory deficit. She exhibits normal muscle tone. Coordination and gait normal.  Tremor in hands only.  Skin: Skin is warm and dry. No rash noted. She is not diaphoretic. No erythema. No pallor.  Psychiatric: She has a normal mood and affect. Her behavior is normal. Judgment and thought content normal.  Nursing note and vitals reviewed.      Assessment & Plan:  Other specified hypothyroidism - Plan: TSH, T4, Free  Essential hypertension - Stable and controlled on current medications. Plan: Comprehensive metabolic panel  Hereditary essential tremor- Discussed available treatments with pt. She agrees to trial of low-dose primidone.  Need for prophylactic vaccination and inoculation against influenza - Plan: Flu Vaccine QUAD 36+ mos IM   Meds ordered this encounter  Medications  . primidone (MYSOLINE) 50 MG tablet    Sig: Take 1 tablet hs for 5 days then increase to 1 tablet bid.    Dispense:  180 tablet    Refill:  1

## 2014-12-04 ENCOUNTER — Encounter: Payer: Self-pay | Admitting: Family Medicine

## 2015-01-17 ENCOUNTER — Other Ambulatory Visit: Payer: Self-pay | Admitting: Family Medicine

## 2015-03-03 ENCOUNTER — Telehealth: Payer: Self-pay

## 2015-03-03 NOTE — Telephone Encounter (Signed)
Patient LMOVM Monday 8:06 AM  [facility] will be sending over a medical release form so she can schedule an appointment with them.   [phone]

## 2015-03-06 NOTE — Telephone Encounter (Signed)
Request received. Last 3 years of records faxed thru [facility] to [facility].

## 2016-04-12 ENCOUNTER — Ambulatory Visit (INDEPENDENT_AMBULATORY_CARE_PROVIDER_SITE_OTHER): Payer: BLUE CROSS/BLUE SHIELD | Admitting: Family Medicine

## 2016-04-12 VITALS — BP 114/84 | HR 74 | Temp 98.0°F | Resp 18 | Ht 66.0 in | Wt 214.0 lb

## 2016-04-12 DIAGNOSIS — R7302 Impaired glucose tolerance (oral): Secondary | ICD-10-CM

## 2016-04-12 DIAGNOSIS — K121 Other forms of stomatitis: Secondary | ICD-10-CM | POA: Diagnosis not present

## 2016-04-12 LAB — POCT CBC
GRANULOCYTE PERCENT: 67.4 % (ref 37–80)
HCT, POC: 41.1 % (ref 37.7–47.9)
HEMOGLOBIN: 14.4 g/dL (ref 12.2–16.2)
Lymph, poc: 2 (ref 0.6–3.4)
MCH: 28.8 pg (ref 27–31.2)
MCHC: 35 g/dL (ref 31.8–35.4)
MCV: 82.1 fL (ref 80–97)
MID (cbc): 1 — AB (ref 0–0.9)
MPV: 6.9 fL (ref 0–99.8)
POC Granulocyte: 6.2 (ref 2–6.9)
POC LYMPH PERCENT: 21.2 %L (ref 10–50)
POC MID %: 11.4 % (ref 0–12)
Platelet Count, POC: 245 10*3/uL (ref 142–424)
RBC: 5 M/uL (ref 4.04–5.48)
RDW, POC: 14.1 %
WBC: 9.2 10*3/uL (ref 4.6–10.2)

## 2016-04-12 LAB — GLUCOSE, POCT (MANUAL RESULT ENTRY): POC Glucose: 93 mg/dl (ref 70–99)

## 2016-04-12 LAB — POCT RAPID STREP A (OFFICE): Rapid Strep A Screen: NEGATIVE

## 2016-04-12 MED ORDER — MAGIC MOUTHWASH W/LIDOCAINE: 10.0000 mL | ORAL | Status: DC | PRN

## 2016-04-12 MED ORDER — NYSTATIN 100000 UNIT/ML MT SUSP: 5.0000 mL | Freq: Four times a day (QID) | OROMUCOSAL | Status: DC

## 2016-04-12 NOTE — Progress Notes (Signed)
Subjective:    Patient ID: Ashley Stevenson, female    DOB: 09/14/1956, 60 y.o.   MRN: [id number]  04/12/2016  Allergic Reaction and Mammogram   HPI This 60 y.o. female presents for evaluation of throat swelling. Got new dentures upper plate on Mar 15, 2016.  Recommended powder paste; noticed sores in mouth at site of powder.  Yesterday, developed pain in upper gums.  Throat is also swollen.  Very painful throat; almost impossible to swallow.  Usually applied powder on Friday.  Poligrip denture adhesive powder started on Friday. Also started using denture cleanser mint flavor last week without problems.  Denies fever,chills/sweats.  No shortness of breath.  No rash or hives.  No facial swelling.  No neck swelling.   Glucose intolerance; glucose 113 and HgbA1c of 5.8 on 03/17/16.   Review of Systems  Constitutional: Negative for fever, chills, diaphoresis and fatigue.  HENT: Positive for dental problem, mouth sores, sore throat, trouble swallowing and voice change. Negative for congestion, drooling, ear discharge, ear pain, facial swelling, postnasal drip, rhinorrhea, sinus pressure, sneezing and tinnitus.   Eyes: Negative for visual disturbance.  Respiratory: Negative for cough, choking, shortness of breath and wheezing.   Cardiovascular: Negative for chest pain, palpitations and leg swelling.  Gastrointestinal: Negative for nausea, vomiting, abdominal pain, diarrhea and constipation.  Endocrine: Negative for cold intolerance, heat intolerance, polydipsia, polyphagia and polyuria.  Neurological: Negative for dizziness, tremors, seizures, syncope, facial asymmetry, speech difficulty, weakness, light-headedness, numbness and headaches.    Past Medical History  Diagnosis Date  . Hyperlipidemia   . Hypertension   . Anxiety   . Hypothyroidism   . Left ureteral calculus   . Left nephrolithiasis   . History of kidney stones   . MVP (mitral valve prolapse)     per pt  dx 1970's with no in echo  severe yrs--  occasional palpitation  . 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 First degree heart block    Past Surgical History  Procedure Laterality Date  . Tonsillectomy and adenoidectomy  age 89  . Mandible surgery Bilateral 1999    upper and lower retain hardware (for osteoarthritis)  . Strabismus surgery Bilateral age 89  . Tubal ligation  1995  . Appendectomy  1987  . Cystoscopy with retrograde pyelogram, ureteroscopy and stent placement Left 02/14/2014    Procedure: CYSTOSCOPY WITH RETROGRADE PYELOGRAM,  DIAGNOSTIC URETEROSCOPY AND STENT PLACEMENT;  Surgeon: Sebastian Acheheodore Manny, MD;  [facility];  Service: Urology;  Laterality: Left;  . Cystoscopy with retrograde pyelogram, ureteroscopy and stent placement Left 03/20/2014    Procedure: CYSTOSCOPY WITH RETROGRADE PYELOGRAM, URETEROSCOPY AND STENT EXCHANGE;  Surgeon: Sebastian Acheheodore Manny, MD;  [facility] [place];  Service: Urology;  Laterality: Left;   Allergies  Allergen Reactions  . Benazepril Hcl Rash  . Tessalon Perles Other (See Comments)    "bad feeling"  . Sulfa Antibiotics Rash    Social History   Social History  . Marital Status: Single    Spouse Name: N/A  . Number of Children: N/A  . Years of Education: N/A   Occupational History  . [employer] engineer[employer]    Social History Main Topics  . Smoking status: Never Smoker   . Smokeless tobacco: Never Used  . Alcohol Use: No  . Drug Use:  No  . Sexual Activity: Not on file   Other Topics Concern  . Not on file   Social History Narrative   Family History  Problem Relation Age of Onset  . Heart disease Mother   . Hyperlipidemia Mother   . Hypertension Mother   . Hyperlipidemia Father   . Hypertension Sister   . Cancer Sister   . Hypertension Sister   . Cancer Sister   . Hypertension Sister   . Hypertension  Sister   . Hypertension Sister   . Hypertension Sister        Objective:    BP 114/84 mmHg  Pulse 74  Temp(Src) 98 F (36.7 C) (Oral)  Resp 18  Ht 5\' 6"  (1.676 m)  Wt 214 lb (97.07 kg)  BMI 34.56 kg/m2  SpO2 94% Physical Exam  Constitutional: She is oriented to person, place, and time. She appears well-developed and well-nourished. No distress.  HENT:  Head: Normocephalic and atraumatic.  Right Ear: Tympanic membrane, external ear and ear canal normal.  Left Ear: Tympanic membrane, external ear and ear canal normal.  Nose: Nose normal.  Mouth/Throat: Uvula is midline. Mucous membranes are not pale, not dry and not cyanotic. Oral lesions present. No uvula swelling or lacerations. Posterior oropharyngeal erythema present. No oropharyngeal exudate, posterior oropharyngeal edema or tonsillar abscesses.  Eyes: Conjunctivae and EOM are normal. Pupils are equal, round, and reactive to light.  Neck: Normal range of motion. Neck supple. Carotid bruit is not present. No thyromegaly present.  Cardiovascular: Normal rate, regular rhythm, normal heart sounds and intact distal pulses.  Exam reveals no gallop and no friction rub.   No murmur heard. Pulmonary/Chest: Effort normal and breath sounds normal. She has no wheezes. She has no rales.  Abdominal: Soft. Bowel sounds are normal. She exhibits no distension and no mass. There is no tenderness. There is no rebound and no guarding.  Lymphadenopathy:    She has no cervical adenopathy.  Neurological: She is alert and oriented to person, place, and time. No cranial nerve deficit.  Skin: Skin is warm and dry. No rash noted. She is not diaphoretic. No erythema. No pallor.  Psychiatric: She has a normal mood and affect. Her behavior is normal.   Results for orders placed or performed in visit on 04/12/16  POCT rapid strep A  Result Value Ref Range   Rapid Strep A Screen Negative Negative  POCT CBC  Result Value Ref Range   WBC 9.2 4.6 - 10.2  K/uL   Lymph, poc 2.0 0.6 - 3.4   POC LYMPH PERCENT 21.2 10 - 50 %L   MID (cbc) 1.0 (A) 0 - 0.9   POC MID % 11.4 0 - 12 %M   POC Granulocyte 6.2 2 - 6.9   Granulocyte percent 67.4 37 - 80 %G   RBC 5.00 4.04 - 5.48 M/uL   Hemoglobin 14.4 12.2 - 16.2 g/dL   HCT, POC 16.141.1 09.637.7 - 47.9 %   MCV 82.1 80 - 97 fL   MCH, POC 28.8 27 - 31.2 pg   MCHC 35.0 31.8 - 35.4 g/dL   RDW, POC 04.514.1 %   Platelet Count, POC 245 142 - 424 K/uL   MPV 6.9 0 - 99.8 fL  POCT glucose (manual entry)  Result Value Ref Range   POC Glucose 93 70 - 99 mg/dl       Assessment & Plan:   1. Stomatitis   2. Glucose intolerance (impaired glucose tolerance)    -  New.  Felt secondary to intolerance/allergic reaction to denture products. -Rx for Nystatin swish and swallow and Magic Mouthwash provided for comfort and any underlying thrush that is developing; white film mild along tongue and buccal mucosa; thus, will treat for thrush empirically. -stop denture cleanser and paste for two weeks and then introduce slowly.  Orders Placed This Encounter  Procedures  . Culture, Group A Strep    Order Specific Question:  Source    Answer:  oropharynx  . POCT rapid strep A  . POCT CBC  . POCT glucose (manual entry)   Meds ordered this encounter  Medications  . nystatin (MYCOSTATIN) 100000 UNIT/ML suspension    Sig: Take 5 mLs (500,000 Units total) by mouth 4 (four) times daily.    Dispense:  200 mL    Refill:  0  . magic mouthwash w/lidocaine SOLN    Sig: Take 10 mLs by mouth every 2 (two) hours as needed for mouth pain.    Dispense:  360 mL    Refill:  0    No Follow-up on file.    Kristi Paulita Fujita, M.D. [facility] [address] [place], [place]  [address] [phone] phone [phone] fax

## 2016-04-12 NOTE — Patient Instructions (Addendum)
1.  Stop denture powder and denture cleanser. 2. In one to two weeks, you can start cleaning dentures with denture cleanser but only for the three minutes recommended. 3.  Use Nystatin mouthwash as prescribed. 4. Use Magic Mouthwash as needed.    IF you received an x-ray today, you will receive an invoice from [facility]. Please contact [facility] at [phone] with questions or concerns regarding your invoice.   IF you received labwork today, you will receive an invoice from [facility]. Please contact [facility] at [phone] with questions or concerns regarding your invoice.   Our billing staff will not be able to assist you with questions regarding bills from these companies.  You will be contacted with the lab results as soon as they are available. The fastest way to get your results is to activate your My Chart account. Instructions are located on the last page of this paperwork. If you have not heard from us regarding the results in 2 weeks, please contact this office.     Stomatitis Stomatitis is a condition that causes inflammation in your mouth. It can affect a part of your mouth or your whole mouth. The condition often affects your cheek, teeth, gums, lips, and tongue. Stomatitis can also affect the mucous membranes that surround your mouth (mucosa). Pain from stomatitis can make it hard for you to eat or drink. Severe cases of this condition can lead to dehydration or poor nutrition. CAUSES Common causes of this condition include:  Viruses, such as cold sores or oral herpes and shingles.  Canker sores.  Bacterial infections.  Fungus or yeast infections, such as oral thrush.  Not getting adequate nutrition.  Injury to your mouth. This can be from:  Dentures or braces that do not fit well.  Biting your tongue or cheek.  Burning your mouth.  Having sharp or broken teeth.  Gum disease.  Using tobacco, especially  chewing tobacco.  Allergies to foods, medicines, or substances that are used in your mouth.  Medicines, including cancer medicines (chemotherapy), antihistamines, and seizure medicines. In some cases, the cause may not be known. RISK FACTORS This condition is more likely to develop in people who:  Have poor oral hygiene or poor nutrition.  Have any condition that causes a dry mouth.  Are under a lot of physical or emotional stress.  Have any condition that weakens the body's defense system (immune system).  Are being treated for cancer.  Smoke. SYMPTOMS The most common symptoms of this condition are pain, swelling, and redness inside your mouth. The pain may feel like burning or stinging. It may get worse from eating or drinking. Other symptoms include:  Painful, shallow sores (ulcers) in the mouth.  Blisters in the mouth.  Bleeding gums.  Swollen gums.  Irritability and fatigue.  Bad breath.  Bad taste in the mouth.  Fever. DIAGNOSIS This condition is diagnosed with a physical exam to check for bleeding gums and mouth ulcers. You may also have other tests, including:  Blood tests to look for infection or vitamin deficiencies.  Mouth swab to get a fluid sample to test for bacteria (culture).  Tissue sample from an ulcer to examine under a microscope (biopsy). TREATMENT Treatment for stomatitis depends on the cause. Treatment may include medicines, such as:  Over-the counter (OTC) pain medicines.  Topical anesthetic to numb the area if you have severe pain.  Antibiotics to treat a bacterial infection.  Antifungals to treat a fungal infection.  Antivirals  to treat a viral infection.  Mouth rinses that contain steroids to reduce the swelling in your mouth.  Other medicines to coat or numb your mouth. HOME CARE INSTRUCTIONS Medicines  Take medicines only as directed by your health care provider.  If you were prescribed an antibiotic, finish all of it  even if you start to feel better. Lifestyle  Practice good oral hygiene:  Gently brush your teeth with a soft, nylon-bristled toothbrush two times each day.  Floss your teeth every day.  Have your teeth cleaned regularly, as recommended by your dentist.  Eat a balanced diet.Do not eat:  Spicy foods.  Citrus, such as oranges.  Foods that have sharp edges, such as chips.  Avoid any foods or other allergens that you think may be causing your stomatitis.  If you have dentures, make sure that they are properly fitted.  Do not use any tobacco products, including cigarettes, chewing tobacco, or electronic cigarettes. If you need help quitting, ask your health care provider.  Find ways to reduce stress. Try yoga or meditation. Ask your health care provider for other ideas. General Instructions  Use a salt-water rinse for pain as directed by your health care provider. Mix 1 tsp of salt in 2 cups of water.  Drink enough fluid to keep your urine clear or pale yellow. This will keep you hydrated. SEEK MEDICAL CARE IF:  Your symptoms get worse.  You develop new symptoms, especially:  A rash.  New symptoms that do not involve your mouth area.  Your symptoms last longer than three weeks.  Your stomatitis goes away and then returns.  You have a harder time eating and drinking normally.  You have increasing fatigue or weakness.  You lose your appetite or you feel nauseous.  You have a fever.   This information is not intended to replace advice given to you by your health care provider. Make sure you discuss any questions you have with your health care provider.   Document Released: 07/25/2007 Document Revised: 02/11/2015 Document Reviewed: 09/23/2014 Elsevier Interactive Patient Education [employer]2016 Elsevier Inc.

## 2016-04-14 LAB — CULTURE, GROUP A STREP: ORGANISM ID, BACTERIA: NORMAL

## 2016-05-09 ENCOUNTER — Encounter: Payer: Self-pay | Admitting: Family Medicine

## 2016-11-30 ENCOUNTER — Ambulatory Visit (INDEPENDENT_AMBULATORY_CARE_PROVIDER_SITE_OTHER): Payer: BLUE CROSS/BLUE SHIELD | Admitting: Physician Assistant

## 2016-11-30 VITALS — BP 128/80 | HR 81 | Temp 98.9°F | Resp 17 | Ht 67.0 in | Wt 211.0 lb

## 2016-11-30 DIAGNOSIS — J069 Acute upper respiratory infection, unspecified: Secondary | ICD-10-CM | POA: Diagnosis not present

## 2016-11-30 DIAGNOSIS — B9789 Other viral agents as the cause of diseases classified elsewhere: Secondary | ICD-10-CM | POA: Diagnosis not present

## 2016-11-30 MED ORDER — HYDROCODONE-HOMATROPINE 5-1.5 MG/5ML PO SYRP: 2.5000 mL | ORAL_SOLUTION | Freq: Every day | ORAL | 0 refills | Status: AC

## 2016-11-30 NOTE — Patient Instructions (Signed)
     IF you received an x-ray today, you will receive an invoice from [facility]. Please contact [facility] at [phone] with questions or concerns regarding your invoice.   IF you received labwork today, you will receive an invoice from [facility]. Please contact [facility] at [phone] with questions or concerns regarding your invoice.   Our billing staff will not be able to assist you with questions regarding bills from these companies.  You will be contacted with the lab results as soon as they are available. The fastest way to get your results is to activate your My Chart account. Instructions are located on the last page of this paperwork. If you have not heard from us regarding the results in 2 weeks, please contact this office.     

## 2016-11-30 NOTE — Progress Notes (Signed)
11/30/2016 2:10 PM   DOB: 07/27/1956 / MRN: [id number]  SUBJECTIVE:  Ashley Stevenson is a 61 y.o. female presenting for 5 days of cough that is bothering her ribs.  Assoicates tinnitus, laryngitis, sore throat along with nasal congestion. Reports that she has no appetite at this time. Has been taking mucinex and ibuprofen with good relief.   Immunization History  Administered Date(s) Administered  . Influenza Split 07/11/2012  . Influenza Whole 06/07/2010  . Influenza,inj,Quad PF,36+ Mos 09/05/2013, 11/08/2014  . Pneumococcal Polysaccharide-23 06/07/2010  . Tdap 06/07/2010     She is allergic to benazepril hcl; tessalon perles; and sulfa antibiotics.   She  has a past medical history of Anxiety; First degree heart block; History of kidney stones; Hyperlipidemia; Hypertension; Hypothyroidism; Injury of facial nerve; Left nephrolithiasis; Left ureteral calculus; MVP (mitral valve prolapse); and Numbness of lip.    She  reports that she has never smoked. She has never used smokeless tobacco. She reports that she does not drink alcohol or use drugs. She  reports that she does not engage in sexual activity. The patient  has a past surgical history that includes Tonsillectomy and adenoidectomy (age 89); Mandible surgery (Bilateral, 1999); Strabismus surgery (Bilateral, age 89); Tubal ligation (1995); Appendectomy (1987); Cystoscopy with retrograde pyelogram, ureteroscopy and stent placement (Left, 02/14/2014); and Cystoscopy with retrograde pyelogram, ureteroscopy and stent placement (Left, 03/20/2014).  Her family history includes Cancer in her sister and sister; Heart disease in her mother; Hyperlipidemia in her father and mother; Hypertension in her mother, sister, sister, sister, sister, sister, and sister.  Review of Systems  Constitutional: Negative for fever.  Respiratory: Positive for cough and sputum production. Negative for hemoptysis and shortness of breath.   Cardiovascular: Negative for  chest pain and leg swelling.  Musculoskeletal: Negative for falls and myalgias.  Neurological: Negative for dizziness and headaches.    The problem list and medications were reviewed and updated by myself where necessary and exist elsewhere in the encounter.   OBJECTIVE:  BP 128/80 (BP Location: Right Arm, Patient Position: Sitting, Cuff Size: Normal)   Pulse 81   Temp 98.9 F (37.2 C) (Oral)   Resp 17   Ht 5\' 7"  (1.702 m)   Wt 211 lb (95.7 kg)   SpO2 97%   BMI 33.05 kg/m   Physical Exam  Constitutional: She is oriented to person, place, and time. She appears well-nourished. No distress.  HENT:  Right Ear: Tympanic membrane normal.  Left Ear: Tympanic membrane normal.  Nose: Rhinorrhea (clear) present.  Mouth/Throat: Uvula is midline, oropharynx is clear and moist and mucous membranes are normal.  Eyes: EOM are normal. Pupils are equal, round, and reactive to light.  Cardiovascular: Normal rate.   Pulmonary/Chest: Effort normal and breath sounds normal. She has no wheezes. She has no rales.  Peak flow 400  Abdominal: She exhibits no distension.  Neurological: She is alert and oriented to person, place, and time. No cranial nerve deficit. Gait normal.  Skin: Skin is dry. She is not diaphoretic.  Psychiatric: She has a normal mood and affect.  Vitals reviewed.  Lab Results  Component Value Date   CREATININE 0.84 11/08/2014     No results found for this or any previous visit (from the past 72 hour(s)).  No results found.  ASSESSMENT AND PLAN:  Ashley Stevenson was seen today for bronchitis.  Diagnoses and all orders for this visit:  Viral URI with cough: Her exam is reassuring.  Advised symptomatic care and RTC if  symptoms change or fail to remiss in the next five days or so.  -     HYDROcodone-homatropine (HYCODAN) 5-1.5 MG/5ML syrup; Take 2.5-5 mLs by mouth at bedtime.    The patient is advised to call or return to clinic if she does not see an improvement in symptoms,  or to seek the care of the closest emergency department if she worsens with the above plan.   Deliah Boston, MHS, PA-C [facility] 11/30/2016 2:10 PM

## 2016-12-02 ENCOUNTER — Ambulatory Visit (INDEPENDENT_AMBULATORY_CARE_PROVIDER_SITE_OTHER): Payer: BLUE CROSS/BLUE SHIELD | Admitting: Physician Assistant

## 2016-12-02 VITALS — BP 106/76 | HR 70 | Temp 98.5°F | Resp 16 | Ht 66.0 in | Wt 209.4 lb

## 2016-12-02 DIAGNOSIS — H1032 Unspecified acute conjunctivitis, left eye: Secondary | ICD-10-CM | POA: Diagnosis not present

## 2016-12-02 DIAGNOSIS — H5712 Ocular pain, left eye: Secondary | ICD-10-CM | POA: Diagnosis not present

## 2016-12-02 MED ORDER — POLYMYXIN B-TRIMETHOPRIM 10000-0.1 UNIT/ML-% OP SOLN: 1.0000 [drp] | OPHTHALMIC | 0 refills | Status: AC

## 2016-12-02 NOTE — Progress Notes (Signed)
Ashley Stevenson  MRN: [id number] DOB: Apr 14, 1956  PCP: Bertram Savin, PA-C  Subjective:  Pt is a 61 year old female PMH HTN, hyper thyroidism, HLD and anxiety who presents to clinic for eye problem x 1 day. She wears glasses, no contacts.  Recent bronchitis from which she is still recovering. She was here 2 days ago for her bronchitis. Noted her left eye was red and irritated at that time, she was advised to RTC if symptoms worsen.  Today c/o worsening L eye pain, redness, swelling of eye lids. Difficulty looking side to side. Painful surrounding eye. Increased swelling. "goopy" and crusted shut in the morning. +yellowish drainage.  She tried allergy eye drops yesterday - not helping.  Denies decrease in vision, blurry vision, double vision.   Review of Systems  Constitutional: Negative for chills, diaphoresis, fatigue and fever.  HENT: Negative for congestion, postnasal drip, rhinorrhea, sinus pressure, sneezing and sore throat.   Eyes: Positive for pain, discharge, redness and itching.  Respiratory: Negative for cough, chest tightness, shortness of breath and wheezing.   Gastrointestinal: Negative for diarrhea, nausea and vomiting.  Neurological: Negative for weakness, light-headedness and headaches.    Patient Active Problem List   Diagnosis Date Noted  . Ureteral stone 03/01/2014  . Hyperlipidemia   . Hypertension   . Hypothyroidism   . Anxiety     Current Outpatient Prescriptions on File Prior to Visit  Medication Sig Dispense Refill  . aspirin 81 MG tablet Take 81 mg by mouth 2 (two) times daily.     . fish oil-omega-3 fatty acids 1000 MG capsule Take 2 g by mouth daily.    Marland Kitchen HYDROcodone-homatropine (HYCODAN) 5-1.5 MG/5ML syrup Take 2.5-5 mLs by mouth at bedtime. 30 mL 0  . levothyroxine (SYNTHROID, LEVOTHROID) 75 MCG tablet TAKE 1 TABLET DAILY BEFORE BREAKFAST.  "OV NEEDED FOR ADDITIONAL REFILLS" 30 tablet 0  . metoprolol (LOPRESSOR) 50 MG tablet TAKE 1 TABLET  TWICE A DAY.  "OV NEEDED FOR ADDITIONAL REFILLS 60 tablet 0  . Multiple Vitamin (MULTIVITAMIN) tablet Take 1 tablet by mouth daily.    . pravastatin (PRAVACHOL) 80 MG tablet TAKE 1 TABLET EVERY MORNING.  'OV NEEDED FOR ADDITIONAL REFILLS" 30 tablet 0   No current facility-administered medications on file prior to visit.     Allergies  Allergen Reactions  . Benazepril Hcl Rash  . Tessalon Perles Other (See Comments)    "bad feeling"  . Sulfa Antibiotics Rash     Objective:  BP 106/76 (BP Location: Left Arm, Patient Position: Sitting, Cuff Size: Large)   Pulse 70   Temp 98.5 F (36.9 C) (Oral)   Resp 16   Ht 5\' 6"  (1.676 m)   Wt 209 lb 6.4 oz (95 kg)   SpO2 94%   BMI 33.80 kg/m   Physical Exam  Constitutional: She is oriented to person, place, and time and well-developed, well-nourished, and in no distress. No distress.  Eyes: Pupils are equal, round, and reactive to light. Right eye exhibits no discharge. Left eye exhibits discharge. No foreign body present in the left eye. Right conjunctiva is not injected. Left conjunctiva is injected.  Cardiovascular: Normal rate, regular rhythm and normal heart sounds.   Neurological: She is alert and oriented to person, place, and time. GCS score is 15.  Skin: Skin is warm and dry.  Psychiatric: Mood, memory, affect and judgment normal.  Vitals reviewed.   Assessment and Plan :  1. Acute bacterial conjunctivitis of left eye  2. Pain of left eye - trimethoprim-polymyxin b (POLYTRIM) ophthalmic solution; Place 1 drop into the left eye every 4 (four) hours. Max: 6 doses/day  Dispense: 10 mL; Refill: 0 - Supportive care discussed. Advised pt to watch out for red flag symptoms and make appt with her eye doctor immediately. She understands and agrees.   Marco CollieWhitney Colletta Spillers, PA-C  [facility] 12/02/2016 8:23 AM

## 2016-12-02 NOTE — Patient Instructions (Addendum)
See below for home care tips.  Please come back if you are not improving following treatment.   Thank you for coming in today. I hope you feel we met your needs.  Feel free to call [facility] if you have any questions or further requests.  Please consider signing up for MyChart if you do not already have it, as this is a great way to communicate with me.  Best,  Whitney McVey, PA-C    Bacterial Conjunctivitis Bacterial conjunctivitis is an infection of the clear membrane that covers the white part of your eye and the inner surface of your eyelid (conjunctiva). When the blood vessels in your conjunctiva become inflamed, your eye becomes red or pink, and it will probably feel itchy. Bacterial conjunctivitis spreads very easily from person to person (is contagious). It also spreads easily from one eye to the other eye. What are the causes? This condition is caused by several common bacteria. You may get the infection if you come into close contact with another person who is infected. You may also come into contact with items that are contaminated with the bacteria, such as a face towel, contact lens solution, or eye makeup. What increases the risk? This condition is more likely to develop in people who:  Are exposed to other people who have the infection.  Wear contact lenses.  Have a sinus infection.  Have had a recent eye injury or surgery.  Have a weak body defense system (immune system).  Have a medical condition that causes dry eyes. What are the signs or symptoms? Symptoms of this condition include:  Eye redness.  Tearing or watery eyes.  Itchy eyes.  Burning feeling in your eyes.  Thick, yellowish discharge from an eye. This may turn into a crust on the eyelid overnight and cause your eyelids to stick together.  Swollen eyelids.  Blurred vision. How is this diagnosed? Your health care provider can diagnose this condition based on your symptoms and medical history. Your  health care provider may also take a sample of discharge from your eye to find the cause of your infection. This is rarely done. How is this treated? Treatment for this condition includes:  Antibiotic eye drops or ointment to clear the infection more quickly and prevent the spread of infection to others.  Oral antibiotic medicines to treat infections that do not respond to drops or ointments, or last longer than 10 days.  Cool, wet cloths (cool compresses) placed on the eyes.  Artificial tears applied 2-6 times a day. Follow these instructions at home: Medicines  Take or apply your antibiotic medicine as told by your health care provider. Do not stop taking or applying the antibiotic even if you start to feel better.  Take or apply over-the-counter and prescription medicines only as told by your health care provider.  Be very careful to avoid touching the edge of your eyelid with the eye drop bottle or the ointment tube when you apply medicines to the affected eye. This will keep you from spreading the infection to your other eye or to other people. Managing discomfort  Gently wipe away any drainage from your eye with a warm, wet washcloth or a cotton ball.  Apply a cool, clean washcloth to your eye for 10-20 minutes, 3-4 times a day. General instructions  Do not wear contact lenses until the inflammation is gone and your health care provider says it is safe to wear them again. Ask your health care provider how to  sterilize or replace your contact lenses before you use them again. Wear glasses until you can resume wearing contacts.  Avoid wearing eye makeup until the inflammation is gone. Throw away any old eye cosmetics that may be contaminated.  Change or wash your pillowcase every day.  Do not share towels or washcloths. This may spread the infection.  Wash your hands often with soap and water. Use paper towels to dry your hands.  Avoid touching or rubbing your eyes.  Do not  drive or use heavy machinery if your vision is blurred. Contact a health care provider if:  You have a fever.  Your symptoms do not get better after 10 days. Get help right away if:  You have a fever and your symptoms suddenly get worse.  You have severe pain when you move your eye.  You have facial pain, redness, or swelling.  You have sudden loss of vision. This information is not intended to replace advice given to you by your health care provider. Make sure you discuss any questions you have with your health care provider. Document Released: 09/27/2005 Document Revised: 02/05/2016 Document Reviewed: 07/10/2015 Elsevier Interactive Patient Education  2017 [employer].   IF you received an x-ray today, you will receive an invoice from [facility]. Please contact [facility] at [phone] with questions or concerns regarding your invoice.   IF you received labwork today, you will receive an invoice from [facility]. Please contact [facility] at [phone] with questions or concerns regarding your invoice.   Our billing staff will not be able to assist you with questions regarding bills from these companies.  You will be contacted with the lab results as soon as they are available. The fastest way to get your results is to activate your My Chart account. Instructions are located on the last page of this paperwork. If you have not heard from [place] regarding the results in 2 weeks, please contact this office.

## 2016-12-08 ENCOUNTER — Ambulatory Visit (INDEPENDENT_AMBULATORY_CARE_PROVIDER_SITE_OTHER): Payer: BLUE CROSS/BLUE SHIELD | Admitting: Emergency Medicine

## 2016-12-08 VITALS — BP 128/82 | HR 71 | Temp 98.2°F | Ht 66.0 in | Wt 207.6 lb

## 2016-12-08 DIAGNOSIS — J040 Acute laryngitis: Secondary | ICD-10-CM

## 2016-12-08 DIAGNOSIS — R491 Aphonia: Secondary | ICD-10-CM | POA: Diagnosis not present

## 2016-12-08 DIAGNOSIS — J069 Acute upper respiratory infection, unspecified: Secondary | ICD-10-CM

## 2016-12-08 MED ORDER — AZITHROMYCIN 250 MG PO TABS: ORAL_TABLET | ORAL | 0 refills | Status: AC

## 2016-12-08 MED ORDER — PREDNISONE 20 MG PO TABS: 40.0000 mg | ORAL_TABLET | Freq: Every day | ORAL | 0 refills | Status: AC

## 2016-12-08 NOTE — Progress Notes (Signed)
Ashley Stevenson 61 y.o.   Chief Complaint  Patient presents with  . Sore Throat    X 2 weeks  . Hoarse    X 4 days    HISTORY OF PRESENT ILLNESS: [name] complaining of hoarse voice and sore throat.  URI   This is a new problem. The current episode started 1 to 4 weeks ago. The problem has been waxing and waning. There has been no fever. Associated symptoms include congestion, coughing, headaches, rhinorrhea, sinus pain and a sore throat. Pertinent negatives include no abdominal pain, chest pain, diarrhea, dysuria, ear pain, nausea, neck pain, rash, vomiting or wheezing. Associated symptoms comments: Loss of voice and conjunctivitis.     Prior to Admission medications   Medication Sig Start Date End Date Taking? Authorizing Provider  aspirin 81 MG tablet Take 81 mg by mouth 2 (two) times daily.    Yes Ashley DavenportKaren L Richter, MD  fish oil-omega-3 fatty acids 1000 MG capsule Take 2 g by mouth daily.   Yes Historical Provider, MD  levothyroxine (SYNTHROID, LEVOTHROID) 75 MCG tablet TAKE 1 TABLET DAILY BEFORE BREAKFAST.  "OV NEEDED FOR ADDITIONAL REFILLS" 01/17/15  Yes Ashley Jeffery, PA-C  metoprolol (LOPRESSOR) 50 MG tablet TAKE 1 TABLET TWICE A DAY.  "OV NEEDED FOR ADDITIONAL REFILLS 01/17/15  Yes Ashley Jeffery, PA-C  Multiple Vitamin (MULTIVITAMIN) tablet Take 1 tablet by mouth daily.   Yes Historical Provider, MD  pravastatin (PRAVACHOL) 80 MG tablet TAKE 1 TABLET EVERY MORNING.  'OV NEEDED FOR ADDITIONAL REFILLS" 01/17/15  Yes Ashley Jeffery, PA-C  trimethoprim-polymyxin b (POLYTRIM) ophthalmic solution Place 1 drop into the left eye every 4 (four) hours. Max: 6 doses/day 12/02/16 12/09/16 Yes Ashley Whitney McVey, PA-C    Allergies  Allergen Reactions  . Benazepril Hcl Rash  . Tessalon Perles Other (See Comments)    "bad feeling"  . Sulfa Antibiotics Rash    Patient Active Problem List   Diagnosis Date Noted  . Ureteral stone 03/01/2014  . Hyperlipidemia   .  Hypertension   . Hypothyroidism   . Anxiety     Past Medical History:  Diagnosis Date  . Anxiety   . First degree heart block   . History of kidney stones   . Hyperlipidemia   . Hypertension   . Hypothyroidism   . 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 Left nephrolithiasis   . Left ureteral calculus   . MVP (mitral valve prolapse)    per pt  dx 1970's with no in echo severe yrs--  occasional palpitation  . 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 APPENDECTOMY  1987  . CYSTOSCOPY WITH RETROGRADE PYELOGRAM, URETEROSCOPY AND STENT PLACEMENT Left 02/14/2014   Procedure: CYSTOSCOPY WITH RETROGRADE PYELOGRAM,  DIAGNOSTIC URETEROSCOPY AND STENT PLACEMENT;  Surgeon: Ashley Acheheodore Manny, MD;  [facility];  Service: Urology;  Laterality: Left;  . CYSTOSCOPY WITH RETROGRADE PYELOGRAM, URETEROSCOPY AND STENT PLACEMENT Left 03/20/2014   Procedure: CYSTOSCOPY WITH RETROGRADE PYELOGRAM, URETEROSCOPY AND STENT EXCHANGE;  Surgeon: Ashley Acheheodore Manny, MD;  [facility];  Service: Urology;  Laterality: Left;  Marland Kitchen. MANDIBLE SURGERY Bilateral 1999   upper and lower retain hardware (for osteoarthritis)  . STRABISMUS SURGERY Bilateral age 89  . TONSILLECTOMY AND ADENOIDECTOMY  age 89  . TUBAL LIGATION  1995    Social History   Social History  . Marital status: Single    Spouse name: N/A  . Number of children: N/A  . Years of education: N/A   Occupational History  . Book Biomedical engineer    Social History Main Topics  . Smoking status: Never Smoker  . Smokeless tobacco: Never Used  . Alcohol use No  . Drug use: No  . Sexual activity: No   Other Topics Concern  . Not on file   Social History Narrative  . No narrative on file    Family History  Problem Relation Age of Onset  . Heart disease Mother   . Hyperlipidemia Mother     . Hypertension Mother   . Hyperlipidemia Father   . Hypertension Sister   . Cancer Sister   . Hypertension Sister   . Cancer Sister   . Hypertension Sister   . Hypertension Sister   . Hypertension Sister   . Hypertension Sister      Review of Systems  Constitutional: Positive for fever (improved). Negative for malaise/fatigue.  HENT: Positive for congestion, rhinorrhea, sinus pain and sore throat. Negative for ear pain and nosebleeds.   Eyes: Positive for discharge and redness (improving).  Respiratory: Positive for cough. Negative for shortness of breath and wheezing.   Cardiovascular: Negative for chest pain, palpitations and leg swelling.  Gastrointestinal: Negative for abdominal pain, diarrhea, nausea and vomiting.  Genitourinary: Negative for dysuria.  Musculoskeletal: Negative for myalgias and neck pain.  Skin: Negative for rash.  Neurological: Positive for headaches. Negative for dizziness.  All other systems reviewed and are negative.  Vitals:   12/08/16 1611  BP: 128/82  Pulse: 71  Temp: 98.2 F (36.8 C)     Physical Exam  Constitutional: She is oriented to person, place, and time. She appears well-developed and well-nourished.  HENT:  Head: Normocephalic and atraumatic.  Mouth/Throat: Posterior oropharyngeal erythema present.    Eyes: EOM are normal. Pupils are equal, round, and reactive to light. Left conjunctiva is injected.  Neck: Normal range of motion. Neck supple. No JVD present. No thyromegaly present.  Cardiovascular: Normal rate, regular rhythm and normal heart sounds.   Pulmonary/Chest: Effort normal and breath sounds normal.  Abdominal: Soft. She exhibits no distension. There is no tenderness.  Musculoskeletal: Normal range of motion.  Lymphadenopathy:    She has no cervical adenopathy.  Neurological: She is alert and oriented to person, place, and time. No sensory deficit. She exhibits normal muscle tone.  Skin: Skin is warm and dry.  Capillary refill takes less than 2 seconds.  Psychiatric: She has a normal mood and affect. Her behavior is normal.  Vitals reviewed.    ASSESSMENT & PLAN: Ashley Stevenson was seen today for sore throat and hoarse.  Diagnoses and all orders for this visit:  Acute laryngitis  Loss of voice  Acute upper respiratory infection  Laryngitis  Other orders -     azithromycin (ZITHROMAX) 250 MG tablet; Sig as indicated -     predniSONE (DELTASONE) 20 MG tablet; Take 2 tablets (40 mg total) by mouth daily with breakfast.    Patient Instructions       IF you received an x-ray today, you will receive an invoice from [facility]. Please contact [facility] at [phone] with questions or concerns regarding your invoice.   IF you received labwork today, you will receive an invoice from [place]. Please contact [facility] at [phone] with questions or concerns regarding your invoice.  Our billing staff will not be able to assist you with questions regarding bills from these companies.  You will be contacted with the lab results as soon as they are available. The fastest way to get your results is to activate your My Chart account. Instructions are located on the last page of this paperwork. If you have not heard from [place] regarding the results in 2 weeks, please contact this office.      Hoarseness Hoarseness is any abnormal change in your voice.Hoarseness can make it difficult to speak. Your voice may sound raspy, breathy, or strained. Hoarseness is caused by a problem with the vocal cords. The vocal cords are two bands of tissue inside your voice box (larynx). When you speak, your vocal cords move back and forth to create sound. The surfaces of your vocal cords need to be smooth for your voice to sound clear. Swelling or lumps on the vocal cords can cause hoarseness. Common causes of vocal cord problems include:  Upper airway infection.  A long-term  cough.  Straining or overusing your voice.  Smoking.  Allergies.  Vocal cord growths.  Stomach acids that flow up from your stomach and irritate your vocal cords (gastroesophageal reflux). Follow these instructions at home: Watch your condition for any changes. To ease any discomfort that you feel:  Rest your voice. Do not whisper. Whispering can cause muscle strain.  Do not speak in a loud or harsh voice that makes your hoarseness worse.  Do not use any tobacco products, including cigarettes, chewing tobacco, or electronic cigarettes. If you need help quitting, ask your health care provider.  Avoid secondhand smoke.  Do not eat foods that give you heartburn. Heartburn can make gastroesophageal reflux worse.  Do not drink coffee.  Do not drink alcohol.  Drink enough fluids to keep your urine clear or pale yellow.  Use a humidifier if the air in your home is dry. Contact a health care provider if:  You have hoarseness that lasts longer than 3 weeks.  You almost lose or completelylose your voice for longer than 3 days.  You have pain when you swallow or try to talk.  You feel a lump in your neck. Get help right away if:  You have trouble swallowing.  You feel as though you are choking when you swallow.  You cough up blood or vomit blood.  You have trouble breathing. This information is not intended to replace advice given to you by your health care provider. Make sure you discuss any questions you have with your health care provider. Document Released: 09/10/2005 Document Revised: 03/04/2016 Document Reviewed: 09/18/2014 Elsevier Interactive Patient Education  2017 Elsevier Inc.      Edwina Barth, MD [facility]

## 2016-12-08 NOTE — Patient Instructions (Addendum)
     IF you received an x-ray today, you will receive an invoice from [facility]. Please contact [facility] at [phone] with questions or concerns regarding your invoice.   IF you received labwork today, you will receive an invoice from [facility]. Please contact [facility] at [phone] with questions or concerns regarding your invoice.   Our billing staff will not be able to assist you with questions regarding bills from these companies.  You will be contacted with the lab results as soon as they are available. The fastest way to get your results is to activate your My Chart account. Instructions are located on the last page of this paperwork. If you have not heard from us regarding the results in 2 weeks, please contact this office.      Hoarseness Hoarseness is any abnormal change in your voice.Hoarseness can make it difficult to speak. Your voice may sound raspy, breathy, or strained. Hoarseness is caused by a problem with the vocal cords. The vocal cords are two bands of tissue inside your voice box (larynx). When you speak, your vocal cords move back and forth to create sound. The surfaces of your vocal cords need to be smooth for your voice to sound clear. Swelling or lumps on the vocal cords can cause hoarseness. Common causes of vocal cord problems include:  Upper airway infection.  A long-term cough.  Straining or overusing your voice.  Smoking.  Allergies.  Vocal cord growths.  Stomach acids that flow up from your stomach and irritate your vocal cords (gastroesophageal reflux). Follow these instructions at home: Watch your condition for any changes. To ease any discomfort that you feel:  Rest your voice. Do not whisper. Whispering can cause muscle strain.  Do not speak in a loud or harsh voice that makes your hoarseness worse.  Do not use any tobacco products, including cigarettes, chewing tobacco, or electronic cigarettes. If you need help  quitting, ask your health care provider.  Avoid secondhand smoke.  Do not eat foods that give you heartburn. Heartburn can make gastroesophageal reflux worse.  Do not drink coffee.  Do not drink alcohol.  Drink enough fluids to keep your urine clear or pale yellow.  Use a humidifier if the air in your home is dry. Contact a health care provider if:  You have hoarseness that lasts longer than 3 weeks.  You almost lose or completelylose your voice for longer than 3 days.  You have pain when you swallow or try to talk.  You feel a lump in your neck. Get help right away if:  You have trouble swallowing.  You feel as though you are choking when you swallow.  You cough up blood or vomit blood.  You have trouble breathing. This information is not intended to replace advice given to you by your health care provider. Make sure you discuss any questions you have with your health care provider. Document Released: 09/10/2005 Document Revised: 03/04/2016 Document Reviewed: 09/18/2014 Elsevier Interactive Patient Education  2017 [employer]

## 2018-03-07 ENCOUNTER — Encounter: Payer: Self-pay | Admitting: Family Medicine

## 2023-09-19 ENCOUNTER — Ambulatory Visit (HOSPITAL_BASED_OUTPATIENT_CLINIC_OR_DEPARTMENT_OTHER): Payer: Medicare HMO | Attending: Pain Medicine | Admitting: Physical Therapy

## 2023-09-19 ENCOUNTER — Other Ambulatory Visit: Payer: Self-pay

## 2023-09-19 ENCOUNTER — Encounter (HOSPITAL_BASED_OUTPATIENT_CLINIC_OR_DEPARTMENT_OTHER): Payer: Self-pay | Admitting: Physical Therapy

## 2023-09-19 DIAGNOSIS — M542 Cervicalgia: Secondary | ICD-10-CM | POA: Insufficient documentation

## 2023-09-19 DIAGNOSIS — M6281 Muscle weakness (generalized): Secondary | ICD-10-CM | POA: Diagnosis present

## 2023-09-19 DIAGNOSIS — R293 Abnormal posture: Secondary | ICD-10-CM | POA: Diagnosis present

## 2023-09-19 NOTE — Therapy (Addendum)
 OUTPATIENT PHYSICAL THERAPY CERVICAL EVALUATION PHYSICAL THERAPY DISCHARGE SUMMARY  Visits from Start of Care: 1  Current functional level related to goals / functional outcomes: unknown   Remaining deficits: unknown   Education / Equipment: unknown   Patient agrees to discharge. Patient goals were not met. Patient is being discharged due to not returning since the last visit.  Addend Adriana Hopping Laneta Pintos) Zaniah Titterington MPT 02/29/24 12:31 PM [facility] [address] [place], [place], [address] Phone: [phone]   Fax:  [phone]    Patient Name: Ashley Stevenson MRN: [id number] DOB:[birth year], 67 y.o., female Today's Date: 09/19/2023  END OF SESSION:  PT End of Session - 09/19/23 1257     Visit Number 1    Number of Visits 16    Date for PT Re-Evaluation 11/19/23    PT Start Time 1150    PT Stop Time 1230    PT Time Calculation (min) 40 min    Activity Tolerance Patient tolerated treatment well    Behavior During Therapy [facility] for tasks assessed/performed             Past Medical History:  Diagnosis Date   Anxiety    First degree heart block    History of kidney stones    Hyperlipidemia    Hypertension    Hypothyroidism    Injury of facial nerve    secondary to mandible surgery in 1999--  lower lip/ jaw numb   Left nephrolithiasis    Left ureteral calculus    MVP (mitral valve prolapse)    per pt  dx 1970's with no in echo severe yrs--  occasional palpitation   Numbness of lip    secondary to nerve damage from mandible surgery   Past Surgical History:  Procedure Laterality Date   APPENDECTOMY  1987   CYSTOSCOPY WITH RETROGRADE PYELOGRAM, URETEROSCOPY AND STENT PLACEMENT Left 02/14/2014   Procedure: CYSTOSCOPY WITH RETROGRADE PYELOGRAM,  DIAGNOSTIC URETEROSCOPY AND STENT PLACEMENT;  Surgeon: Osborn Blaze, MD;  Location: [facility];  Service: Urology;  Laterality: Left;   CYSTOSCOPY WITH  RETROGRADE PYELOGRAM, URETEROSCOPY AND STENT PLACEMENT Left 03/20/2014   Procedure: CYSTOSCOPY WITH RETROGRADE PYELOGRAM, URETEROSCOPY AND STENT EXCHANGE;  Surgeon: Osborn Blaze, MD;  Location[facility];  Service: Urology;  Laterality: Left;   MANDIBLE SURGERY Bilateral 1999   upper and lower retain hardware (for osteoarthritis)   STRABISMUS SURGERY Bilateral age 28   TONSILLECTOMY AND ADENOIDECTOMY  age 13   TUBAL LIGATION  1995   Patient Active Problem List   Diagnosis Date Noted   Acute laryngitis 12/08/2016   Ureteral stone 03/01/2014   Hyperlipidemia    Hypertension    Hypothyroidism    Anxiety     PCP: Newt Barefoot,   REFERRING PROVIDER: Trudi Fus, MD   REFERRING DIAG:  M54.2 (ICD-10-CM) - Cervicalgia  M79.18 (ICD-10-CM) - Myalgia, other site  G89.29 (ICD-10-CM) - Other chronic pain    THERAPY DIAG:  Cervicalgia  Abnormal posture  Muscle weakness (generalized)  Rationale for Evaluation and Treatment: Rehabilitation  ONSET DATE: exacerbated this year but have had it for a couple of years  SUBJECTIVE:  SUBJECTIVE STATEMENT: I just had a treatment on Friday and its pretty good. (Scapulothoracic bursa injection under fluoroscopic guidance).  It only works for a few weeks.  Have had 2. Tried having massage which made me dizzy and nauseas although I did feel some relief. I have not had any PT for this. I walk about 2.5 miles and ride a bike when I am not hurting. Hand dominance: Left  PERTINENT HISTORY:  Familial tremors  DX: Chronic myofascial pain Ambulatory referral to Physical Therapy   3. Scapulothoracic bursitis of both shoulders PAIN BURSA   chronic bilateral neck, shoulder, and thoracic pain that has been worsening since April. She  works at a desk and roughly 1 year ago she had to start using a keyboard which was not ergonomically placed and soon after developed exquisite pain in her upper and mid back that has been significantly affecting her daily function. This is interfered with her ability to spend time with and pick up her granddaughter who is 3.   PAIN:  Are you having pain? Yes: NPRS scale: 4/10; worst 4/10; least 0/10 Pain location: cervical spine to T2 upper and middle traps Pain description: sharp, aching, burning, tingling, and pressure Aggravating factors: house work, driving, most movements and general daily activities and reaching Relieving factors: ice/heat  PRECAUTIONS: None  RED FLAGS: None     WEIGHT BEARING RESTRICTIONS: No  FALLS:  Has patient fallen in last 6 months? Yes. 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  PLOF: Independent  PATIENT GOALS: pain relief  NEXT MD VISIT: 1 month  OBJECTIVE:  Note: Objective measures were completed at Evaluation unless otherwise noted.  DIAGNOSTIC FINDINGS:  XR cerv spine: 1.  C3-C4 and C5-C6 retrolisthesis.  2.  Multilevel degenerative disc height loss, most pronounced at C5-C6.  3.  Multilevel facet and uncovertebral joint spurring.  4.  No acute fractures identified.  5.  Partially imaged remote facial fractures status post ORIF.   PATIENT SURVEYS:  FOTO Primary score 51% with goal of 59%  COGNITION: Overall cognitive status: Within functional limits for tasks assessed  SENSATION: WFL  POSTURE: head off center to left; rounded shoulders  PALPATION: TTP throughout upper and middle trap Middle> upper; lower traps; rhomboids   CERVICAL ROM:   Active ROM A/PROM (deg) eval  Flexion full  Extension full  Right lateral flexion 35  Left lateral flexion 20  Right rotation 60  Left rotation  40   (Blank rows = not tested)  UPPER EXTREMITY ROM:  Active ROM Right eval Left eval  Shoulder flexion 130 120  Shoulder extension    Shoulder abduction full full  Shoulder adduction full full  Shoulder extension    Shoulder internal rotation T7 T7  Shoulder external rotation T2 T2  Elbow flexion    Elbow extension    Wrist flexion    Wrist extension    Wrist ulnar deviation    Wrist radial deviation    Wrist pronation    Wrist supination     (Blank rows = not tested)  UPPER EXTREMITY MMT:  MMT Right eval Left eval  Shoulder flexion 4 4  Shoulder extension    Shoulder abduction 4 4  Shoulder adduction 4 4  Shoulder extension    Shoulder internal rotation 5 5  Shoulder external rotation 5 4  Middle trapezius    Lower  trapezius    Elbow flexion    Elbow extension    Wrist flexion    Wrist extension    Wrist ulnar deviation    Wrist radial deviation    Wrist pronation    Wrist supination    Grip strength norm 18-32kg 18kg 16 kg   (Blank rows = not tested)   LUMBAR ROM:   Active  A/PROM  eval  Flexion full  Extension full  Right lateral flexion Full P!  Left lateral flexion full  Right rotation full  Left rotation full   (Blank rows = not tested)  TODAY'S TREATMENT:                                                                                                                              Eval  PATIENT EDUCATION:  Education details: Discussed eval findings, rehab rationale, aquatic program progression/POC and pools in area. Patient is in agreement  Person educated: Patient Education method: Explanation Education comprehension: verbalized understanding  HOME EXERCISE PROGRAM: TBA  ASSESSMENT:  CLINICAL IMPRESSION: Patient is a 67 y.o. f who was seen today for physical therapy evaluation and treatment for cervalgia and Scapulothoracic bursitis. Pt presents highly pain sensitive throughout upper cervical through mid thoracic area. Appears to be  some dyskinesis of scapular and shoulder movement with shoulder flex although she does not tolerate palpation or mobilization of scapula due to pain. Her ability to reach overhead today pt reports, is due to her recent injections stating she is unable to complete prior. Last injections wore off within a couple of weeks. Pt will benefit from skilled physical therapy intervention to improve ue and throacic/scapular movement, decrease pain and improve fucntion.  OBJECTIVE IMPAIRMENTS: decreased activity tolerance, dizziness, increased muscle spasms, impaired flexibility, postural dysfunction, and pain.   ACTIVITY LIMITATIONS: carrying, lifting, bathing, dressing, reach over head, and hygiene/grooming  PARTICIPATION LIMITATIONS: cleaning, driving, occupation, and yard work  PERSONAL FACTORS: Time since onset of injury/illness/exacerbation are also affecting patient's functional outcome.   REHAB POTENTIAL: Good  CLINICAL DECISION MAKING: Evolving/moderate complexity  EVALUATION COMPLEXITY: Moderate   GOALS: Goals reviewed with patient? Yes  SHORT TERM GOALS: Target date: 10/15/23  Pt will have full cervical ROM  Baseline: see chart Goal status: INITIAL  2.  Pt to be instructed and indep with aquatic exercises HEP and be able to verbalize benefits of using the proprieties of water for muscle relaxation and pain control Baseline:  Goal status: INITIAL  3.  Pt will tolerate assessment of bilat scapular movement and rehab Baseline:unable to tolerate  Goal status: INITIAL     LONG TERM GOALS: Target date: 11/19/23  Pt to meet stated Foto Goal of 59% Baseline: 51 Goal status: INITIAL  2.  Pt will improve grip strength bilat by 5 kg  Baseline:  Goal status: INITIAL  3.  Pt will improve ue and scapular strength to 5/5 Baseline:  Goal status: INITIAL  4.  Pt will  maintain pain sensitivity under 4/10 to demonstrated improvement management of condition Baseline:  Goal status:  INITIAL  5.  Pt will report little to no limitations with cervical rotation for safety with driving Baseline: limited unable to turn Goal status: INITIAL     PLAN:  PT FREQUENCY: 1-2x/week  PT DURATION: 8 weeks  PLANNED INTERVENTIONS: 97164- PT Re-evaluation, 97110-Therapeutic exercises, 97530- Therapeutic activity, 97112- Neuromuscular re-education, 97535- Self Care, [address]- Manual therapy, [phone]- Gait training, [phone]- Orthotic Fit/training, [phone]- Aquatic Therapy, [phone]- Electrical stimulation (unattended), Patient/Family education, Balance training, Stair training, Taping, Dry Needling, Joint mobilization, Joint manipulation, DME instructions, Cryotherapy, and Moist heat  PLAN FOR NEXT SESSION: land based: assessment of scapular rotation and mobility.  Consider DN for pain management; strengthening and ROM cervial, shoulder/scap Aquatic 3-4 sessions for ROM and gentle Strengthening ue and thoracic spine. Instruction on benefits of aquatic ex/intervention for wellness   Frankie Phillippa Straub, PT 09/19/2023, 1:04 PM
